# Patient Record
Sex: Female | Born: 1970 | Race: White | Hispanic: No | Marital: Married | Smoking: Former smoker
Health system: Southern US, Community
[De-identification: ages and names within clinical notes are randomized; demographics above are authoritative.]

## PROBLEM LIST (undated history)

## (undated) DIAGNOSIS — F419 Anxiety disorder, unspecified: Secondary | ICD-10-CM

## (undated) DIAGNOSIS — N63 Unspecified lump in unspecified breast: Secondary | ICD-10-CM

## (undated) DIAGNOSIS — IMO0002 Reserved for concepts with insufficient information to code with codable children: Secondary | ICD-10-CM

## (undated) DIAGNOSIS — R229 Localized swelling, mass and lump, unspecified: Secondary | ICD-10-CM

## (undated) DIAGNOSIS — F32A Depression, unspecified: Secondary | ICD-10-CM

## (undated) DIAGNOSIS — F329 Major depressive disorder, single episode, unspecified: Secondary | ICD-10-CM

## (undated) HISTORY — DX: Reserved for concepts with insufficient information to code with codable children: IMO0002

## (undated) HISTORY — DX: Depression, unspecified: F32.A

## (undated) HISTORY — DX: Anxiety disorder, unspecified: F41.9

## (undated) HISTORY — DX: Unspecified lump in unspecified breast: N63.0

## (undated) HISTORY — DX: Localized swelling, mass and lump, unspecified: R22.9

## (undated) HISTORY — DX: Major depressive disorder, single episode, unspecified: F32.9

---

## 2004-07-14 ENCOUNTER — Emergency Department: Payer: Self-pay | Admitting: Unknown Physician Specialty

## 2007-10-02 ENCOUNTER — Other Ambulatory Visit: Payer: Self-pay

## 2007-10-02 ENCOUNTER — Emergency Department: Payer: Self-pay | Admitting: Internal Medicine

## 2009-07-22 ENCOUNTER — Ambulatory Visit: Payer: Self-pay | Admitting: Family Medicine

## 2009-08-22 ENCOUNTER — Ambulatory Visit: Payer: Self-pay | Admitting: Gastroenterology

## 2009-08-28 ENCOUNTER — Ambulatory Visit: Payer: Self-pay | Admitting: Gastroenterology

## 2011-03-29 ENCOUNTER — Ambulatory Visit: Payer: Self-pay | Admitting: Internal Medicine

## 2011-04-02 ENCOUNTER — Ambulatory Visit: Payer: Self-pay

## 2011-04-29 ENCOUNTER — Ambulatory Visit: Payer: Self-pay

## 2011-05-10 ENCOUNTER — Ambulatory Visit: Payer: Self-pay | Admitting: Family Medicine

## 2011-05-10 LAB — CREATININE, SERUM: Creatinine: 0.93 mg/dL (ref 0.60–1.30)

## 2011-10-27 ENCOUNTER — Ambulatory Visit: Payer: Self-pay

## 2012-03-06 ENCOUNTER — Ambulatory Visit: Payer: Self-pay | Admitting: Medical

## 2012-08-08 ENCOUNTER — Ambulatory Visit: Payer: Self-pay | Admitting: Family Medicine

## 2012-09-27 LAB — HM PAP SMEAR: HM Pap smear: NORMAL

## 2012-09-28 ENCOUNTER — Ambulatory Visit: Payer: Self-pay | Admitting: Family Medicine

## 2012-09-29 LAB — HM MAMMOGRAPHY: HM MAMMO: NORMAL

## 2012-11-08 ENCOUNTER — Ambulatory Visit: Payer: Self-pay

## 2013-07-14 ENCOUNTER — Emergency Department: Payer: Self-pay | Admitting: Emergency Medicine

## 2013-09-29 IMAGING — CR DG CHEST 2V
1 series · 2 of 2 positions shown · non-contrast
Comparison: none

REASON FOR EXAM: feels breathless
COMMENTS:

PROCEDURE:     MDR - MDR CHEST PA(OR AP) AND LATERAL  - March 29, 2011  [DATE]
RESULT:     Comparison: None

[Series 1: pa · 0.17mm/px · 2 of 2 slices shown]
[im 1/2]
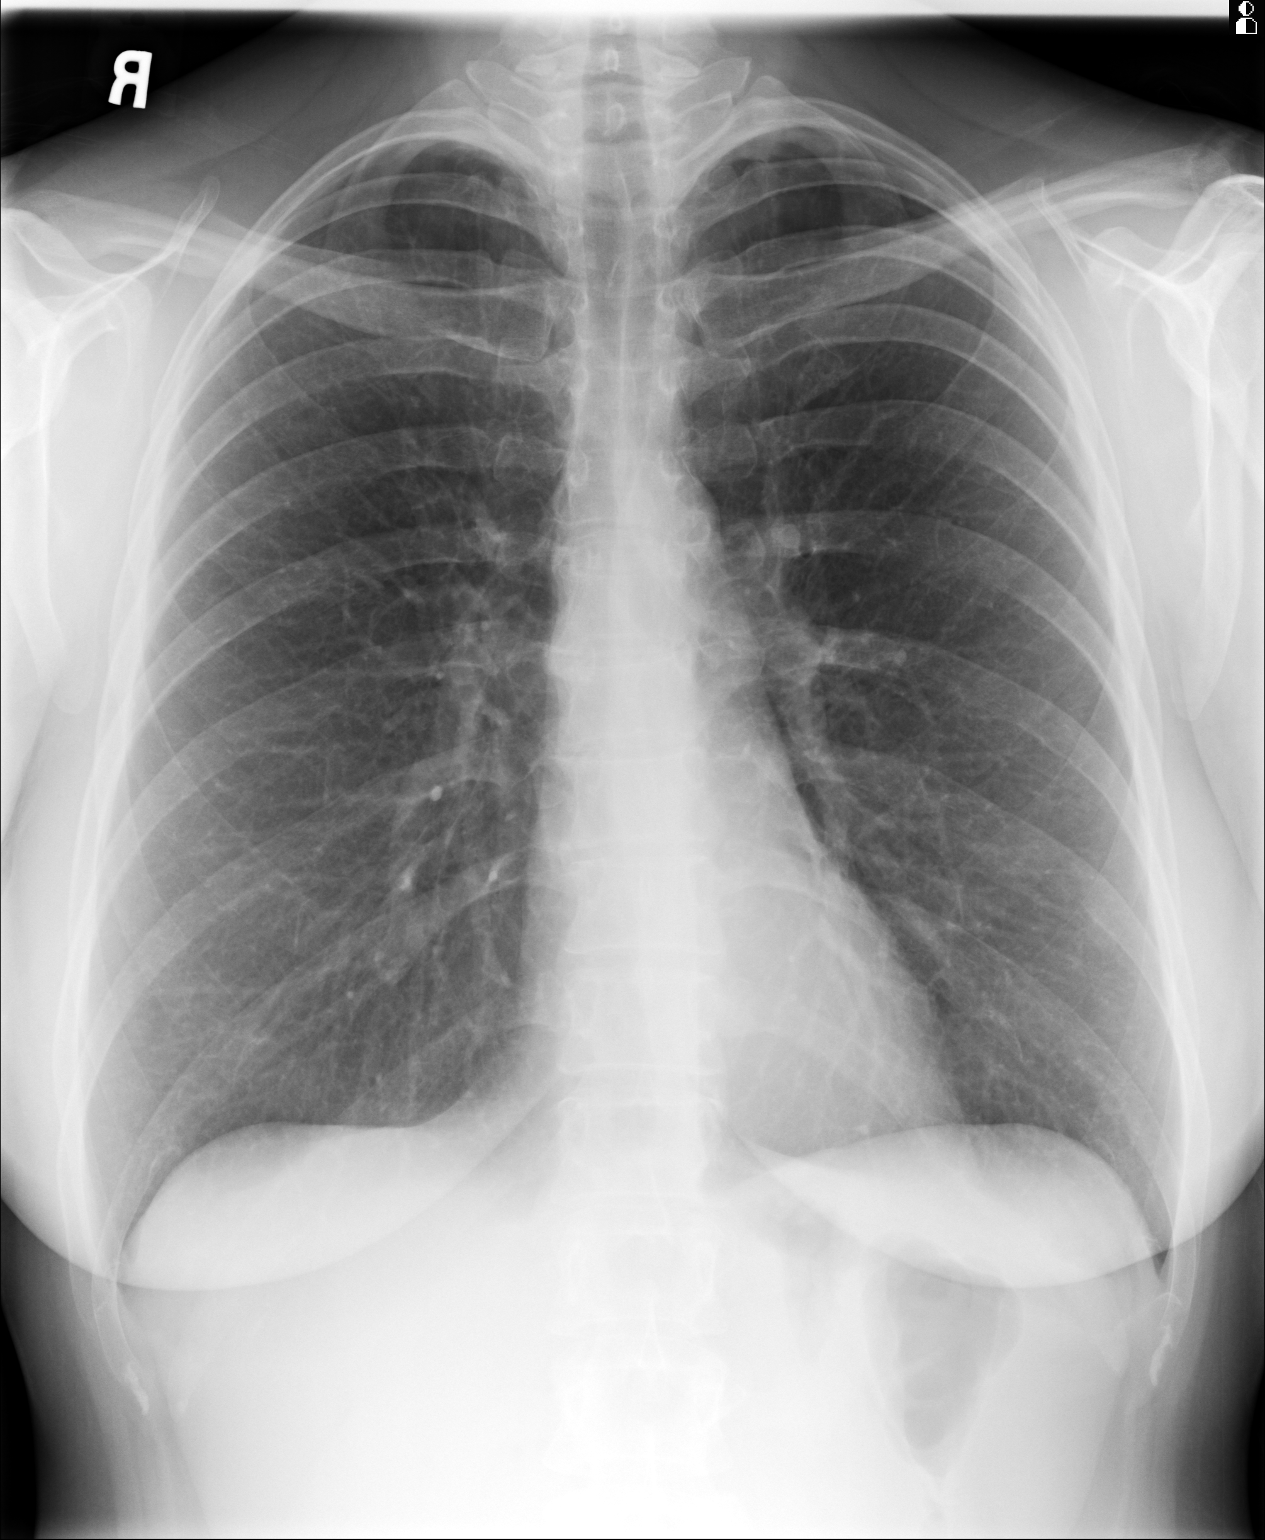
[im 2/2]
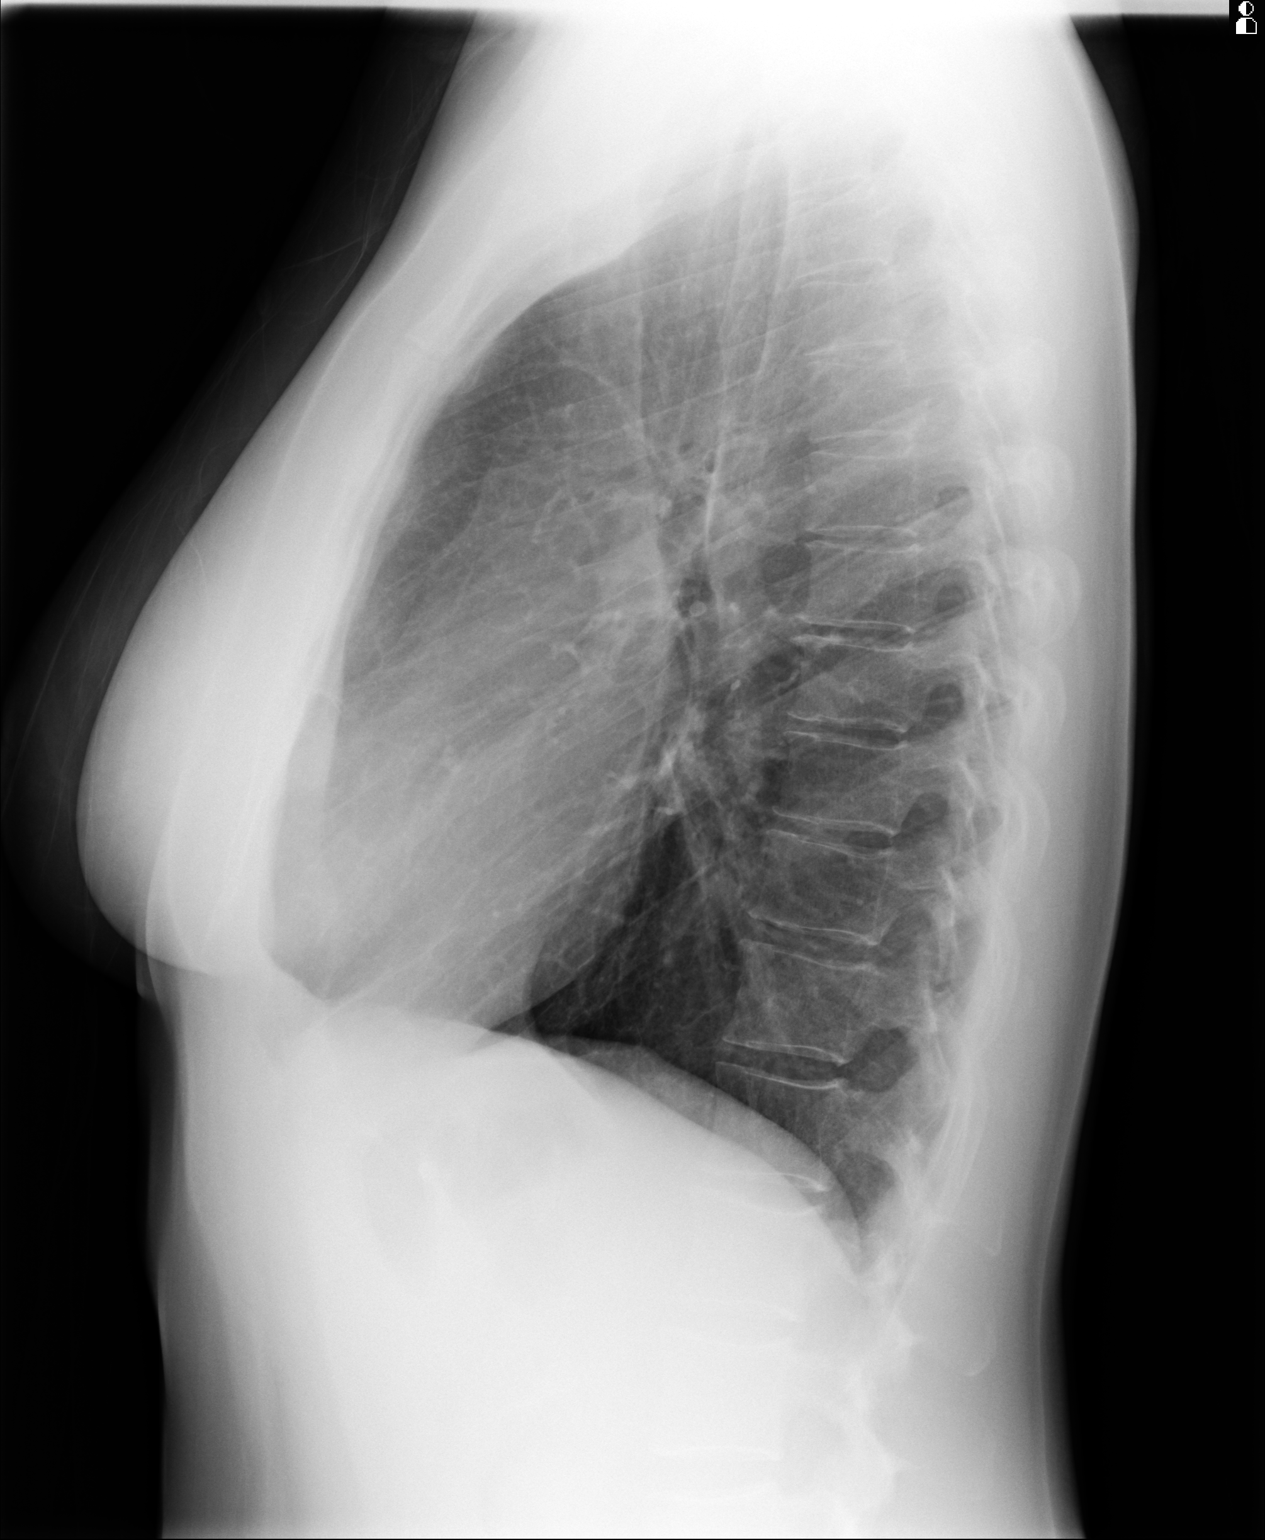

[2 of 2 positions shown; findings below may reference images not displayed]

FINDINGS: PA and lateral chest radiographs are provided.  There is no focal
parenchymal opacity, pleural effusion, or pneumothorax. The heart and
mediastinum are unremarkable.  The osseous structures are unremarkable.
IMPRESSION: No acute disease of the chest.

## 2013-10-03 IMAGING — CR DG CHEST 2V
1 series · 3 of 3 positions shown · non-contrast
Comparison: none

REASON FOR EXAM: chest pain and tightness
COMMENTS:

PROCEDURE:     MDR - MDR CHEST PA(OR AP) AND LATERAL  - April 02, 2011 [DATE]
RESULT:     Comparison: None

[Series 1: pa · 0.17mm/px · 3 of 3 slices shown]
[im 1/3]
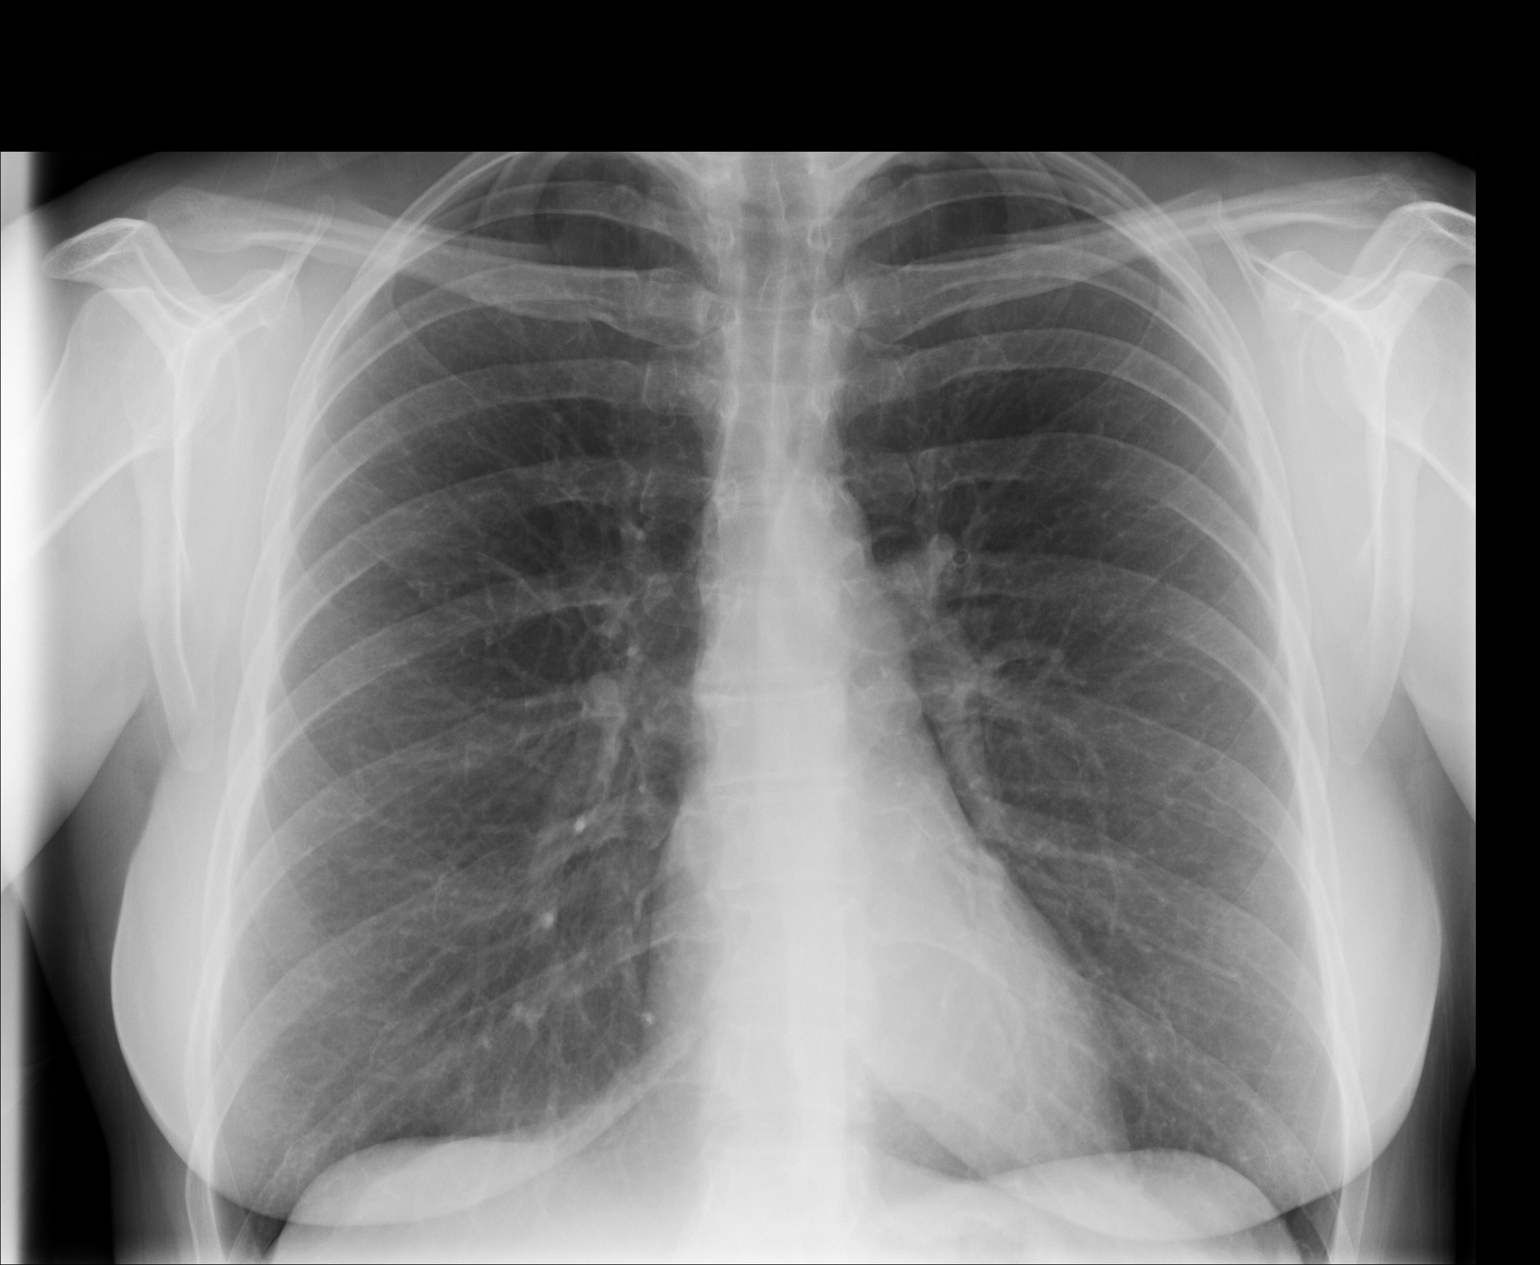
[im 2/3]
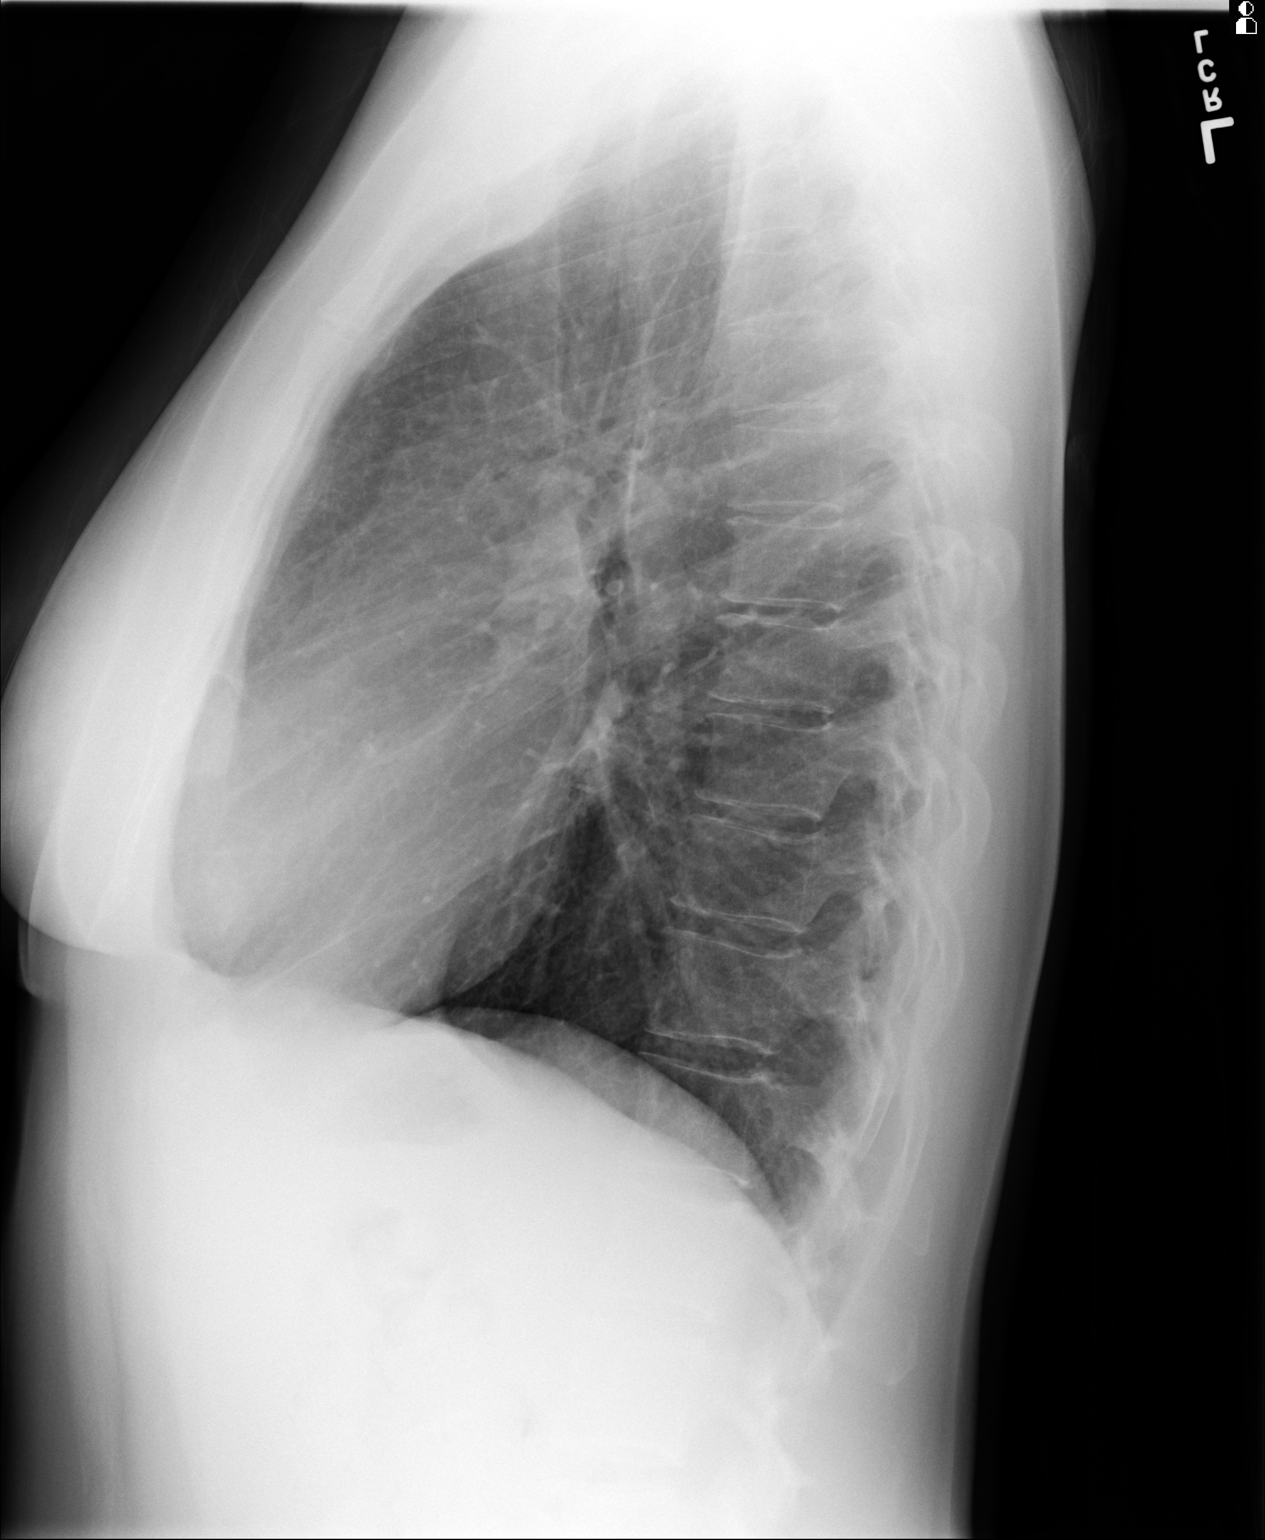
[im 3/3]
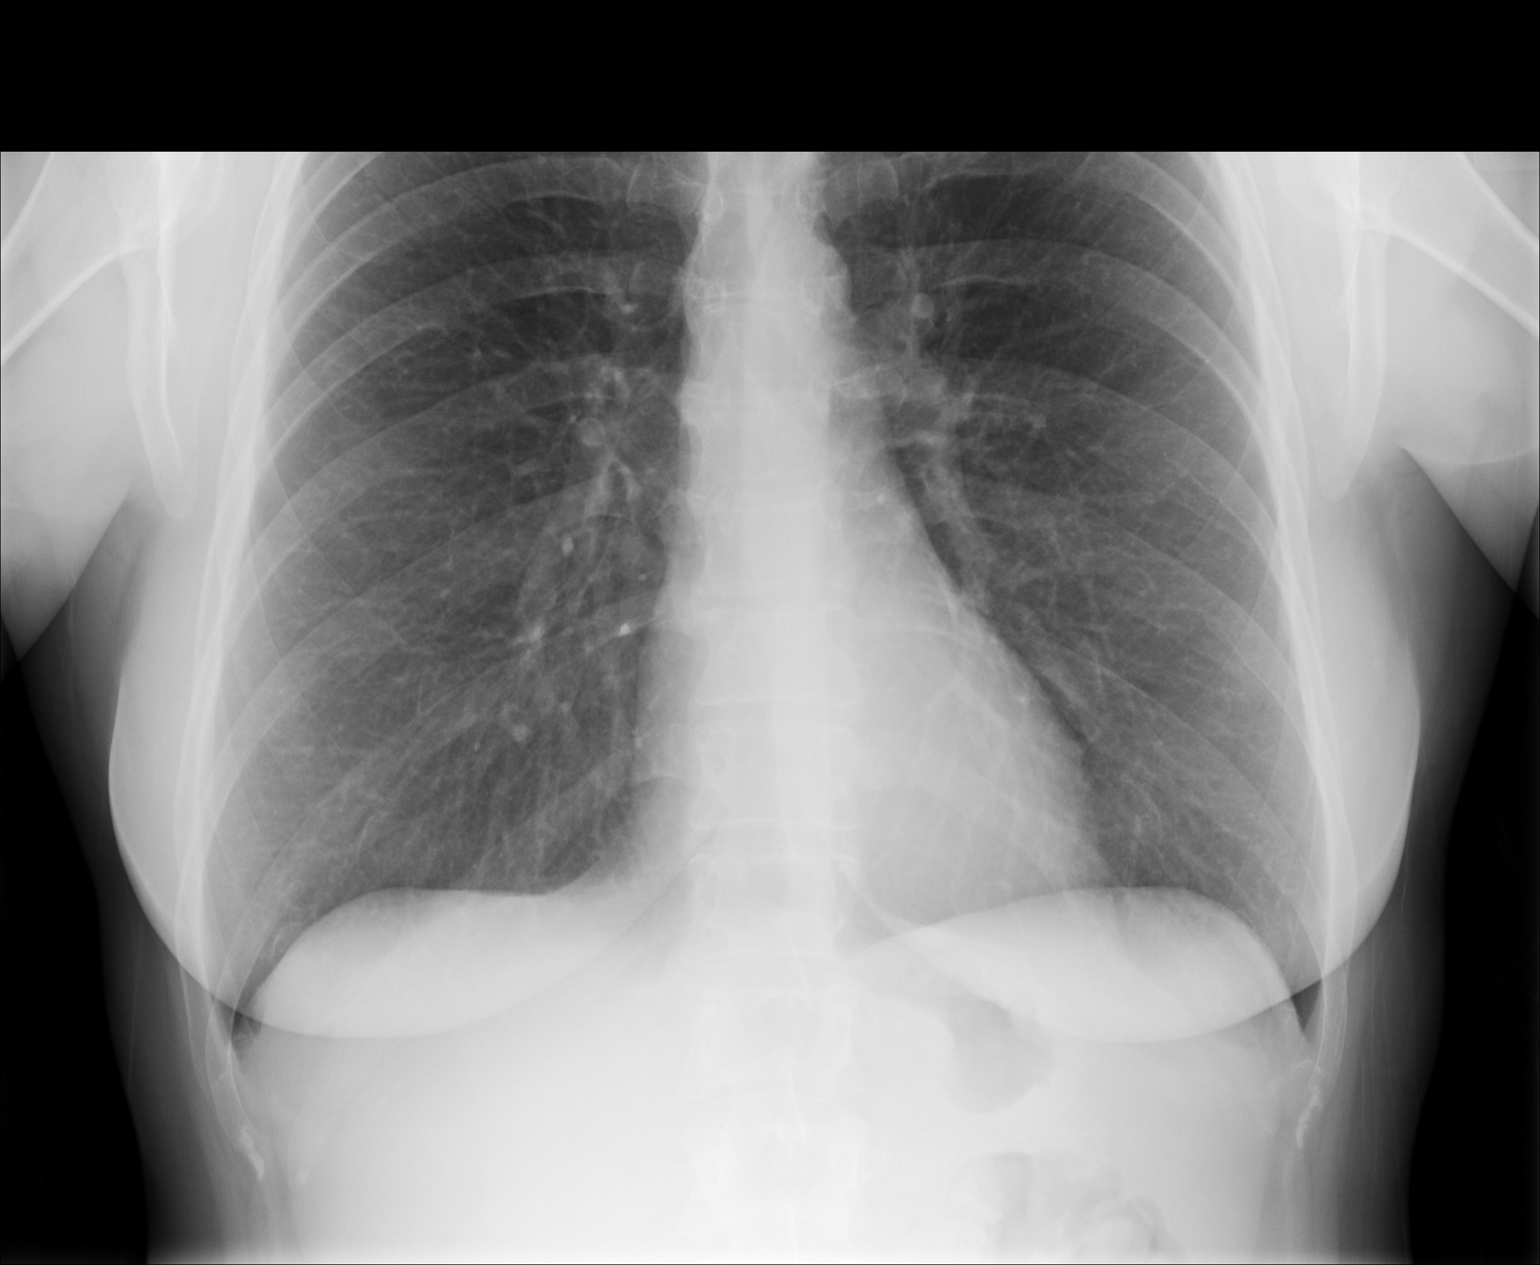

[3 of 3 positions shown; findings below may reference images not displayed]

FINDINGS: PA and lateral chest radiographs are provided.  There is no focal
parenchymal opacity, pleural effusion, or pneumothorax. The heart and
mediastinum are unremarkable.  The osseous structures are unremarkable.
IMPRESSION: No acute disease of the chest.

## 2015-02-18 ENCOUNTER — Ambulatory Visit (INDEPENDENT_AMBULATORY_CARE_PROVIDER_SITE_OTHER): Payer: Managed Care, Other (non HMO) | Admitting: Family Medicine

## 2015-02-18 ENCOUNTER — Encounter: Payer: Self-pay | Admitting: Family Medicine

## 2015-02-18 VITALS — BP 123/76 | HR 73 | Temp 97.7°F | Resp 16 | Ht 69.0 in | Wt 189.8 lb

## 2015-02-18 DIAGNOSIS — F418 Other specified anxiety disorders: Secondary | ICD-10-CM | POA: Diagnosis not present

## 2015-02-18 DIAGNOSIS — Z23 Encounter for immunization: Secondary | ICD-10-CM | POA: Diagnosis not present

## 2015-02-18 DIAGNOSIS — F329 Major depressive disorder, single episode, unspecified: Secondary | ICD-10-CM | POA: Insufficient documentation

## 2015-02-18 DIAGNOSIS — Z8249 Family history of ischemic heart disease and other diseases of the circulatory system: Secondary | ICD-10-CM | POA: Diagnosis not present

## 2015-02-18 DIAGNOSIS — F419 Anxiety disorder, unspecified: Principal | ICD-10-CM

## 2015-02-18 MED ORDER — FLUOXETINE HCL 40 MG PO CAPS: 40.0000 mg | ORAL_CAPSULE | Freq: Two times a day (BID) | ORAL | Status: DC

## 2015-02-18 NOTE — Patient Instructions (Signed)
We will refer you to [facility] for your anxiety and depression. In the meantime I will fill your medications for you until you find a new doctor.

## 2015-02-18 NOTE — Assessment & Plan Note (Signed)
Renew Prozac to cover patient until she can be seen by [facility]. Appears very well controlled.  Alarm symptoms reviewed.  RTC 6 mos if unable to find provider.

## 2015-02-18 NOTE — Progress Notes (Signed)
Subjective:    Patient ID: Tasha Ferguson, female    DOB: 26-Sep-1970, 44 y.o.   MRN: [id number]  HPI: Tasha Ferguson is a 44 y.o. female presenting on 02/18/2015 for Depression   HPI  Pt presents because she needs to find a new mental health professional. Previously saw Dr. Claudie Fisherman he left the practice. She is taking  prozac daily for depression and anxiety.  Seen by psych for 2 years.  Doing well on 80 of prozac past 2 years.  Mild sleep disturbances- trouble staying asleep- awakenings for 20-30 minutes prior to going back to sleep.   Desires flu shot.     Past Medical History  Diagnosis Date  . 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 Depression   . Breast mass in female     R side calcifiction    No current outpatient prescriptions on file prior to visit.   No current facility-administered medications on file prior to visit.    Review of Systems  Constitutional: Negative for fever and chills.  HENT: Negative.   Respiratory: Negative for cough, chest tightness and wheezing.   Cardiovascular: Negative for chest pain and leg swelling.  Gastrointestinal: Negative for nausea, vomiting, abdominal pain, diarrhea and constipation.  Endocrine: Negative.  Negative for cold intolerance, heat intolerance, polydipsia, polyphagia and polyuria.  Genitourinary: Negative for dysuria and difficulty urinating.  Musculoskeletal: Negative.   Neurological: Negative for dizziness, light-headedness and numbness.  Psychiatric/Behavioral: Positive for sleep disturbance. The patient is nervous/anxious.    Per HPI unless specifically indicated above     Objective:    BP 123/76 mmHg  Pulse 73  Temp(Src) 97.7 F (36.5 C) (Oral)  Resp 16  Ht  (1.753 m)  Wt 189 lb 12.8 oz (86.093 kg)  BMI 28.02 kg/m2  LMP 01/28/2015  Wt Readings from Last 3 Encounters:  02/18/15 189 lb 12.8 oz (86.093 kg)    Physical Exam  Constitutional: She is oriented to person, place, and time.  She appears well-developed and well-nourished.  HENT:  Head: Normocephalic and atraumatic.  Neck: Neck supple.  Cardiovascular: Normal rate, regular rhythm and normal heart sounds.  Exam reveals no gallop and no friction rub.   No murmur heard. Pulmonary/Chest: Effort normal and breath sounds normal. She has no wheezes. She exhibits no tenderness.  Abdominal: Soft. Normal appearance and bowel sounds are normal. She exhibits no distension and no mass. There is no tenderness. There is no rebound and no guarding.  Musculoskeletal: Normal range of motion. She exhibits no edema or tenderness.  Lymphadenopathy:    She has no cervical adenopathy.  Neurological: She is alert and oriented to person, place, and time.  Skin: Skin is warm and dry.   GAD 7 : Generalized Anxiety Score 02/18/2015  Nervous, Anxious, on Edge 1  Control/stop worrying 0  Worry too much - different things 1  Trouble relaxing 1  Restless 1  Easily annoyed or irritable 1  Afraid - awful might happen 1  Total GAD 7 Score 6  Anxiety Difficulty Somewhat difficult   Depression screen PHQ 2/9 02/18/2015  Decreased Interest 1  Down, Depressed, Hopeless 1  PHQ - 2 Score 2  Altered sleeping 1  Tired, decreased energy 1  Change in appetite 0  Feeling bad or failure about yourself  0  Trouble concentrating 0  Moving slowly or fidgety/restless 0  Suicidal thoughts 0  PHQ-9 Score 4  Difficult doing work/chores Somewhat difficult  Results for orders placed or performed in visit on 02/18/15  HM MAMMOGRAPHY  Result Value Ref Range   HM Mammogram normal   HM PAP SMEAR  Result Value Ref Range   HM Pap smear normal       Assessment & Plan:   Problem List Items Addressed This Visit      Other   Anxiety and depression - Primary    Renew Prozac to cover patient until she can be seen by [facility]. Appears very well controlled.  Alarm symptoms reviewed.  RTC 6 mos if unable to find provider.       Relevant Medications     FLUoxetine (PROZAC) 40 MG capsule   Other Relevant Orders   Comprehensive Metabolic Panel (CMET)   Ambulatory referral to Psychiatry    Other Visit Diagnoses    Family history of heart disease        Relevant Orders    Lipid Profile    Need for influenza vaccination           Meds ordered this encounter  Medications  . DISCONTD: FLUoxetine (PROZAC) 40 MG capsule    Sig: Take 40 mg by mouth daily.   Marland Kitchen. FLUoxetine (PROZAC) 40 MG capsule    Sig: Take 1 capsule (40 mg total) by mouth 2 (two) times daily.    Dispense:  180 capsule    Refill:  3    Order Specific Question:  Supervising Provider    Answer:  Janeann ForehandHAWKINS JR, JAMES H [[id number]]      Follow up plan: Return in about 6 months (around 08/19/2015).

## 2015-03-21 ENCOUNTER — Ambulatory Visit (INDEPENDENT_AMBULATORY_CARE_PROVIDER_SITE_OTHER): Payer: Managed Care, Other (non HMO) | Admitting: Licensed Clinical Social Worker

## 2015-03-21 DIAGNOSIS — F411 Generalized anxiety disorder: Secondary | ICD-10-CM | POA: Diagnosis not present
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
## 2015-04-29 ENCOUNTER — Ambulatory Visit (INDEPENDENT_AMBULATORY_CARE_PROVIDER_SITE_OTHER): Payer: Managed Care, Other (non HMO) | Admitting: Psychiatry

## 2015-04-29 ENCOUNTER — Encounter: Payer: Self-pay | Admitting: Psychiatry

## 2015-04-29 ENCOUNTER — Ambulatory Visit: Payer: Managed Care, Other (non HMO) | Admitting: Psychiatry

## 2015-04-29 VITALS — BP 128/78 | HR 83 | Temp 97.8°F | Ht 69.0 in | Wt 192.4 lb

## 2015-04-29 DIAGNOSIS — F418 Other specified anxiety disorders: Secondary | ICD-10-CM | POA: Diagnosis not present

## 2015-04-29 DIAGNOSIS — F419 Anxiety disorder, unspecified: Principal | ICD-10-CM

## 2015-04-29 DIAGNOSIS — F329 Major depressive disorder, single episode, unspecified: Secondary | ICD-10-CM

## 2015-04-29 MED ORDER — FLUOXETINE HCL 40 MG PO CAPS: 40.0000 mg | ORAL_CAPSULE | Freq: Two times a day (BID) | ORAL | Status: DC
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
## 2015-06-25 ENCOUNTER — Ambulatory Visit: Payer: Managed Care, Other (non HMO) | Admitting: Psychiatry

## 2015-07-23 ENCOUNTER — Encounter: Payer: Self-pay | Admitting: Psychiatry

## 2015-07-23 ENCOUNTER — Ambulatory Visit (INDEPENDENT_AMBULATORY_CARE_PROVIDER_SITE_OTHER): Payer: 59 | Admitting: Psychiatry

## 2015-07-23 VITALS — BP 122/78 | HR 106 | Temp 98.8°F | Ht 69.0 in | Wt 193.0 lb

## 2015-07-23 DIAGNOSIS — F329 Major depressive disorder, single episode, unspecified: Secondary | ICD-10-CM

## 2015-07-23 DIAGNOSIS — F419 Anxiety disorder, unspecified: Secondary | ICD-10-CM

## 2015-07-23 DIAGNOSIS — F418 Other specified anxiety disorders: Secondary | ICD-10-CM

## 2015-07-23 DIAGNOSIS — F411 Generalized anxiety disorder: Secondary | ICD-10-CM

## 2015-07-23 DIAGNOSIS — F32A Depression, unspecified: Secondary | ICD-10-CM

## 2015-07-23 MED ORDER — VENLAFAXINE HCL ER 37.5 MG PO CP24: 37.5000 mg | ORAL_CAPSULE | Freq: Every day | ORAL | Status: DC

## 2015-07-23 MED ORDER — FLUOXETINE HCL 40 MG PO CAPS: 40.0000 mg | ORAL_CAPSULE | Freq: Every day | ORAL | Status: DC

## 2015-07-23 NOTE — Progress Notes (Signed)
Patient ID: Riccardo Dubinamela N Perko, female   DOB: 1970-06-27, 45 y.o.   MRN: [id number]  Psychiatric Progress Note  Patient Identification: Riccardo Dubinamela N Andaya MRN:  [id number] Date of Evaluation:  07/23/2015 Referral Source: Dr.Chen Chief Complaint:  tired Chief Complaint    Follow-up; Medication Refill     Visit Diagnosis:    ICD-9-CM ICD-10-CM   1. GAD (generalized anxiety disorder) 300.02 F41.1   2. Anxiety and depression 300.4 F41.8 FLUoxetine (PROZAC) 40 MG capsule   Diagnosis:   Patient Active Problem List   Diagnosis Date Noted  . Anxiety and depression [F41.8] 02/18/2015   History of Present Illness:  Ms.Fluitt is a 45 yo WF, seen for follow up of major depression. Patient states she would like to change the prozac , says she feels tired all the time. Taking frequent naps and feeling drained. Fair sleep and appetite. Mood is stable. States she would like to try a different medication to address the tiredness and wean off the Prozac. Patient reports some stressors with her children. States that her 45 year old daughter is moving out to live with her boyfriend. Patient states that she would rather her daughter went to college. States her 45 year old son has Asperger's but down his working currently and also writing a book. He is also planning to to college. Experimented with Cymbalta and Wellbutrin in the past, did not work well.    Past Medical History:  Past Medical History  Diagnosis Date  . 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 Depression   . Breast mass in female     R side calcifiction  . Anxiety     Past Surgical History  Procedure Laterality Date  . Cesarean section      1994, 1998   Family History:  Family History  Problem Relation Age of Onset  . Hypertension Mother   . Depression Mother   . Diabetes Father   . Alcohol abuse Father   . Anxiety disorder Father   . Depression Father   . Alcohol abuse Sister   . Alcohol abuse Brother   . Drug abuse  Brother    Social History:   Social History   Social History  . Marital Status: Married    Spouse Name: N/A  . Number of Children: N/A  . Years of Education: N/A   Social History Main Topics  . Smoking status: Current Every Day Smoker    Types: E-cigarettes  . Smokeless tobacco: Never Used  . Alcohol Use: No  . Drug Use: No  . Sexual Activity: Yes   Other Topics Concern  . None   Social History Narrative   Additional Social History:   Musculoskeletal: Strength & Muscle Tone: within normal limits Gait & Station: normal Patient leans: N/A  Psychiatric Specialty Exam: HPI  ROS  Blood pressure 122/78, pulse 106, temperature 98.8 F (37.1 C), temperature source Tympanic, height 5\' 9"  (1.753 m), weight 193 lb (87.544 kg), SpO2 97 %.Body mass index is 28.49 kg/(m^2).  General Appearance: Casual  Eye Contact:  Fair  Speech:  Clear and Coherent  Volume:  Normal  Mood:  Euthymic  Affect:  Congruent  Thought Process:  Coherent  Orientation:  Full (Time, Place, and Person)  Thought Content:  WDL  Suicidal Thoughts:  No  Homicidal Thoughts:  No  Memory:  Immediate;   Fair Recent;   Fair Remote;   Fair  Judgement:  Fair  Insight:  Fair  Psychomotor Activity:  Normal  Concentration:  Fair  Recall:  [employer] of Knowledge:Fair  Language: Fair  Akathisia:  No  Handed:  Right  AIMS (if indicated):    Assets:  Communication Skills Desire for Improvement Housing Physical Health Social Support  ADL's:  Intact  Cognition: WNL  Sleep:  good   Is the patient at risk to self?  No. Has the patient been a risk to self in the past 6 months?  No. Has the patient been a risk to self within the distant past?  Yes.   Is the patient a risk to others?  No. Has the patient been a risk to others in the past 6 months?  No. Has the patient been a risk to others within the distant past?  No.  Allergies:   Allergies  Allergen Reactions  . Codeine Nausea Only    Nausea and  vomiting   Current Medications: Current Outpatient Prescriptions  Medication Sig Dispense Refill  . FLUoxetine (PROZAC) 40 MG capsule Take 1 capsule (40 mg total) by mouth daily. 30 capsule 0  . venlafaxine XR (EFFEXOR XR) 37.5 MG 24 hr capsule Take 1 capsule (37.5 mg total) by mouth daily. 30 capsule 0   No current facility-administered medications for this visit.    Previous Psychotropic Medications: Yes   Substance Abuse History in the last 12 months:  No.  Consequences of Substance Abuse: Negative  Medical Decision Making:  Established Problem, Stable/Improving (1), Review of Psycho-Social Stressors (1), Review or order clinical lab tests (1) and Review of Medication Regimen & Side Effects (2)  Treatment Plan Summary: Medication management MDD Decrease Prozac to  po qd. Start effexor at 37.5mg  po qd. discussed side effects and benefits including long-term increase in blood pressure. Patient is not interested in seeing a therapist at this time. She states that the issues she has had since a child are not down going to be fixed and she is okay with that. Return to clinic in 2 weeks time or call before if necessary.    Damari Hiltz 5/10/20171:53 PM

## 2015-08-13 ENCOUNTER — Ambulatory Visit
Admission: EM | Admit: 2015-08-13 | Discharge: 2015-08-13 | Disposition: A | Discharge status: Home / Self Care | Payer: Commercial Managed Care - PPO | Attending: Family Medicine | Admitting: Family Medicine

## 2015-08-13 DIAGNOSIS — S39012A Strain of muscle, fascia and tendon of lower back, initial encounter: Secondary | ICD-10-CM | POA: Diagnosis not present

## 2015-08-13 MED ORDER — NAPROXEN 500 MG PO TABS: 500.0000 mg | ORAL_TABLET | Freq: Two times a day (BID) | ORAL | Status: DC

## 2015-08-13 MED ORDER — CYCLOBENZAPRINE HCL 5 MG PO TABS: 5.0000 mg | ORAL_TABLET | Freq: Two times a day (BID) | ORAL | Status: DC | PRN

## 2015-08-13 NOTE — ED Notes (Signed)
Patient complains of low back pain. Patient states that pain started on Monday morning. Patient states that pain occurred once she woke up on Monday and has been progressively getting worse.

## 2015-08-13 NOTE — ED Provider Notes (Signed)
CSN: [id number]     Arrival date & time 08/13/15  1302 History   First MD Initiated Contact with Patient 08/13/15 1427     Chief Complaint  Patient presents with  . Back Pain   (Consider location/radiation/quality/duration/timing/severity/associated sxs/prior Treatment) HPI: Patient presents today with symptoms of lower back pain. Patient states that she was mopping on Sunday and went to bed fine. She woke up on Monday and started to have lower back pain. She states that her symptoms are localized to the lower back and do not radiate anywhere. She denies any tingling numbness of the lower extremities or any foot drop. She denies any urinary symptoms including incontinence. She denies any fever or chills. She denies any known lower back issues. She has tried ice on the area and Tylenol. Her last menstrual period was about 3 weeks ago.  Past Medical History  Diagnosis Date  . 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 Depression   . Breast mass in female     R side calcifiction  . Anxiety    Past Surgical History  Procedure Laterality Date  . Cesarean section      19 94, 1998   Family History  Problem Relation Age of Onset  . Hypertension Mother   . Depression Mother   . Diabetes Father   . Alcohol abuse Father   . Anxiety disorder Father   . Depression Father   . Alcohol abuse Sister   . Alcohol abuse Brother   . Drug abuse Brother    Social History  Substance Use Topics  . Smoking status: Former Smoker    Types: E-cigarettes  . Smokeless tobacco: Never Used  . Alcohol Use: No   OB History    No data available     Review of Systems: Review of systems negative except mentioned above.  Allergies  Codeine  Home Medications   Prior to Admission medications   Medication Sig Start Date End Date Taking? Authorizing Provider  cyclobenzaprine (FLEXERIL) 5 MG tablet Take 1 tablet (5 mg total) by mouth 2 (two) times daily as needed for muscle spasms. Can cause  drowsiness. 08/13/15   Jolene ProvostKirtida Tyresa Prindiville, MD  FLUoxetine (PROZAC) 40 MG capsule Take 1 capsule (40 mg total) by mouth daily. 07/23/15   Himabindu Ravi, MD  naproxen (NAPROSYN) 500 MG tablet Take 1 tablet (500 mg total) by mouth 2 (two) times daily. Take with food. 08/13/15   Jolene ProvostKirtida Jewel Mcafee, MD  venlafaxine XR (EFFEXOR XR) 37.5 MG 24 hr capsule Take 1 capsule (37.5 mg total) by mouth daily. 07/23/15 07/22/16  Himabindu Ravi, MD   Meds Ordered and Administered this Visit  Medications - No data to display  BP 127/80 mmHg  Pulse 97  Temp(Src) 97.6 F (36.4 C) (Tympanic)  Resp 16  Ht 5\' 9"  (1.753 m)  Wt 190 lb (86.183 kg)  BMI 28.05 kg/m2  SpO2 100%  LMP 07/30/2015 No data found.   Physical Exam   GENERAL: NAD RESP: CTA B CARD: RRR BACK: mild generalized tenderness to lower lumbar area, decreased ROM with flexion>extension, 5/5 strength of LEs, -SLR, nv intact, no flank tenderness  NEURO: CN II-XII groslly intact   ED Course  Procedures (including critical care time)  Labs Review Labs Reviewed - No data to display  Imaging Review No results found.    MDM   1. Back strain, initial encounter   Will treat with Naprosyn and Flexeril when necessary. Ice/heat discussed. Recommend patient  follow up with her primary care physician or seek other medical attention if her symptoms do persist or worsen as discussed. Patient may require imaging if her symptoms do persist or worsen.     Jolene Provost, MD 08/13/15 [phone]

## 2017-02-18 ENCOUNTER — Encounter: Payer: Self-pay | Admitting: Nurse Practitioner

## 2017-02-18 ENCOUNTER — Ambulatory Visit: Payer: Commercial Managed Care - PPO | Admitting: Nurse Practitioner

## 2017-02-18 VITALS — BP 103/59 | HR 74 | Temp 98.0°F | Ht 69.0 in | Wt 187.6 lb

## 2017-02-18 DIAGNOSIS — F329 Major depressive disorder, single episode, unspecified: Secondary | ICD-10-CM | POA: Diagnosis not present

## 2017-02-18 DIAGNOSIS — F419 Anxiety disorder, unspecified: Secondary | ICD-10-CM

## 2017-02-18 DIAGNOSIS — F32A Depression, unspecified: Secondary | ICD-10-CM

## 2017-02-18 DIAGNOSIS — F410 Panic disorder [episodic paroxysmal anxiety] without agoraphobia: Secondary | ICD-10-CM

## 2017-02-18 MED ORDER — BUSPIRONE HCL 10 MG PO TABS: 10.0000 mg | ORAL_TABLET | Freq: Three times a day (TID) | ORAL | 1 refills | Status: DC

## 2017-02-18 MED ORDER — FLUOXETINE HCL 20 MG PO CAPS: 20.0000 mg | ORAL_CAPSULE | Freq: Every day | ORAL | 1 refills | Status: DC

## 2017-02-18 NOTE — Progress Notes (Signed)
Subjective:    Patient ID: Tasha Ferguson, female    DOB: 1971/01/03, 46 y.o.   MRN: [id number]  Tasha Ferguson is a 46 y.o. female presenting on 02/18/2017 for Anxiety (depression)   HPI Anxiety and Depression Pt was managed several years ago by psychiatrist Dr. Imogene Burnhen (retired) and was on prozac. Tasha Ferguson continued this medication w/ plan to return to psychiatry, but has no insurance coverage for psychiatrists in the area.  She has not reconnected w/ psychiatry and is currently on fluoxetine 80 mg once daily.  Pt states this dose is because she needed 60 mg once daily and there is no 60 mg pill.  Pt noted little difference in symptoms of anxiety and depression on 80 mg once daily, but a significant worsening of drowsiness and having little energy.  Pt reports significant internal anxiety and panic attacks at least once weekly w/ racing heart rate, shortness of breath, and tunnel vision.  Sometimes occurs w/o trigger.  Pt has known triggers of being near trains, on/under bridges.  She reports no significant life stressors at this time.  GAD 7 : Generalized Anxiety Score 02/18/2017 02/18/2015  Nervous, Anxious, on Edge 2 1  Control/stop worrying 1 0  Worry too much - different things 1 1  Trouble relaxing 1 1  Restless 1 1  Easily annoyed or irritable 2 1  Afraid - awful might happen 0 1  Total GAD 7 Score 8 6  Anxiety Difficulty Somewhat difficult Somewhat difficult    Depression screen [facility] 02/18/2017 02/18/2015  Decreased Interest 1 1  Down, Depressed, Hopeless 1 1  PHQ - 2 Score 2 2  Altered sleeping 2 1  Tired, decreased energy 2 1  Change in appetite 2 0  Feeling bad or failure about yourself  1 0  Trouble concentrating 1 0  Moving slowly or fidgety/restless 1 0  Suicidal thoughts 0 0  PHQ-9 Score 11 4  Difficult doing work/chores Somewhat difficult Somewhat difficult    Social History   Tobacco Use  . Smoking status: Former Smoker    Types: E-cigarettes  . Smokeless  tobacco: Never Used  Substance Use Topics  . Alcohol use: No    Alcohol/week: 0.0 oz  . Drug use: No    Review of Systems Per HPI unless specifically indicated above     Objective:    BP (!) 103/59 (BP Location: Right Arm, Patient Position: Sitting, Cuff Size: Normal)   Pulse 74   Temp 98 F (36.7 C) (Oral)   Ht 5\' 9"  (1.753 m)   Wt 187 lb 9.6 oz (85.1 kg)   BMI 27.70 kg/m   Wt Readings from Last 3 Encounters:  02/18/17 187 lb 9.6 oz (85.1 kg)  08/13/15 190 lb (86.2 kg)  02/18/15 189 lb 12.8 oz (86.1 kg)    Physical Exam  General - healthy, well-appearing, NAD HEENT - Normocephalic, atraumatic Neck - supple, non-tender, no LAD, no thyromegaly Heart - RRR, no murmurs heard Lungs - Clear throughout all lobes, no wheezing, crackles, or rhonchi. Normal work of breathing. Extremeties - non-tender, no edema, cap refill < 2 seconds, peripheral pulses intact +2 bilaterally Skin - warm, dry Neuro - awake, alert, oriented x3, normal gait, no resting or active tremor Psych - Normal mood and affect, normal behavior   Results for orders placed or performed in visit on 02/18/15  HM MAMMOGRAPHY  Result Value Ref Range   HM Mammogram normal   HM PAP SMEAR  Result  Value Ref Range   HM Pap smear normal       Assessment & Plan:   Problem List Items Addressed This Visit      Other   Anxiety and depression - Primary    Currently moderately severe symptoms with 1-2 panic attacks per week.  Patient is currently taking fluoxetine 80 mg once daily with side effects of drowsiness and little energy after last dose increase.  Otherwise is fairly well controlled with ability to take care of daily activities.  Patient does not work outside of home, but does take care of housework and other responsibilities. Denies SI/HI and has no plans to carry out if SI/HI arise.  Plan: 1.  Decrease fluoxetine to 60 mg once daily.  Take three 20 mg capsules per dose. 2.  Start buspirone 10 mg twice daily  for panic disorder. 3.  Encourage stress management. 4.  Follow-up 6 weeks after med changes.  Okay to continue management for now from primary care.  Consider psychiatry referral if needed in future for worsening symptoms.      Relevant Medications   busPIRone (BUSPAR) 10 MG tablet   FLUoxetine (PROZAC) 20 MG capsule   Panic attack    See AP for anxiety and depression.      Relevant Medications   busPIRone (BUSPAR) 10 MG tablet   FLUoxetine (PROZAC) 20 MG capsule      Meds ordered this encounter  Medications  . DISCONTD: FLUoxetine (PROZAC) 20 MG capsule    Sig: Take 1 capsule (20 mg total) by mouth daily.    Dispense:  90 capsule    Refill:  1    Order Specific Question:   Supervising Provider    Answer:   Smitty CordsKARAMALEGOS, ALEXANDER J [[id number]]  . busPIRone (BUSPAR) 10 MG tablet    Sig: Take 1 tablet (10 mg total) by mouth 3 (three) times daily.    Dispense:  60 tablet    Refill:  1    Order Specific Question:   Supervising Provider    Answer:   Smitty CordsKARAMALEGOS, ALEXANDER J [[id number]]  . FLUoxetine (PROZAC) 20 MG capsule    Sig: Take 3 capsules (60 mg total) by mouth daily.    Dispense:  90 capsule    Refill:  1    Order Specific Question:   Supervising Provider    Answer:   Smitty CordsKARAMALEGOS, ALEXANDER J [[id number]]    Follow up plan: Return in about 6 weeks (around 04/01/2017) for anxiety.   A total of 30 minutes was spent face-to-face with this patient. Greater than 50% of this time was spent in counseling and coordination of care with the patient.    Wilhelmina McardleLauren Quenesha Douglass, DNP, AGPCNP-BC Adult Gerontology Primary Care Nurse Practitioner [facility] 02/24/2017, 1:05 PM

## 2017-02-18 NOTE — Patient Instructions (Addendum)
Tasha Ferguson, Thank you for coming in to clinic today.  1. START buspirone 10 mg tablet  - Take 1/2 tablet (5 mg) twice daily for 2 weeks. - On week 3: take 1 full tablet (10 mg) in morning, and 1/2 tablet in evening - On week 4: take 1 full tablet twice daily and continue.  2. FOR your prozac: - REDUCE to 60 mg once daily for the next 2 weeks (take 3 capsule).  - IN 2 weeks, Reduce to 40 mg once daily and continue at the lower dose.  If you need to stay on 60 mg for three weeks that is ok.   Please schedule a follow-up appointment with Wilhelmina McardleLauren Paddy Walthall, AGNP. Return in about 6 weeks (around 04/01/2017) for anxiety.   If you have any other questions or concerns, please feel free to call the clinic or send a message through MyChart. You may also schedule an earlier appointment if necessary.  You will receive a survey after today's visit either digitally by e-mail or paper by [employer] mail. Your experiences and feedback matter to us.  Please respond so we know how we are doing as we provide care for you.   Wilhelmina McardleLauren Zavian Slowey, DNP, AGNP-BC Adult Gerontology Nurse Practitioner [facility], [facility]

## 2017-02-24 ENCOUNTER — Encounter: Payer: Self-pay | Admitting: Nurse Practitioner

## 2017-02-24 DIAGNOSIS — F410 Panic disorder [episodic paroxysmal anxiety] without agoraphobia: Secondary | ICD-10-CM | POA: Insufficient documentation

## 2017-02-24 MED ORDER — FLUOXETINE HCL 20 MG PO CAPS: 60.0000 mg | ORAL_CAPSULE | Freq: Every day | ORAL | 1 refills | Status: DC

## 2017-02-24 NOTE — Assessment & Plan Note (Signed)
See AP for anxiety and depression.

## 2017-02-24 NOTE — Assessment & Plan Note (Addendum)
Currently moderately severe symptoms with 1-2 panic attacks per week.  Patient is currently taking fluoxetine 80 mg once daily with side effects of drowsiness and little energy after last dose increase.  Otherwise is fairly well controlled with ability to take care of daily activities.  Patient does not work outside of home, but does take care of housework and other responsibilities. Denies SI/HI and has no plans to carry out if SI/HI arise.  Plan: 1.  Decrease fluoxetine to 60 mg once daily.  Take three 20 mg capsules per dose. 2.  Start buspirone 10 mg twice daily for panic disorder. 3.  Encourage stress management. 4.  Follow-up 6 weeks after med changes.  Okay to continue management for now from primary care.  Consider psychiatry referral if needed in future for worsening symptoms.

## 2017-05-11 ENCOUNTER — Encounter: Payer: Self-pay | Admitting: Gynecology

## 2017-05-11 ENCOUNTER — Other Ambulatory Visit: Payer: Self-pay

## 2017-05-11 ENCOUNTER — Ambulatory Visit
Admission: EM | Admit: 2017-05-11 | Discharge: 2017-05-11 | Disposition: A | Discharge status: Home / Self Care | Payer: Commercial Managed Care - PPO | Attending: Family Medicine | Admitting: Family Medicine

## 2017-05-11 ENCOUNTER — Ambulatory Visit (INDEPENDENT_AMBULATORY_CARE_PROVIDER_SITE_OTHER): Payer: Commercial Managed Care - PPO

## 2017-05-11 DIAGNOSIS — M25551 Pain in right hip: Secondary | ICD-10-CM

## 2017-05-11 DIAGNOSIS — M25559 Pain in unspecified hip: Secondary | ICD-10-CM

## 2017-05-11 MED ORDER — HYDROCODONE-ACETAMINOPHEN 5-325 MG PO TABS: ORAL_TABLET | ORAL | 0 refills | Status: DC

## 2017-05-11 MED ORDER — PREDNISONE 20 MG PO TABS: ORAL_TABLET | ORAL | 0 refills | Status: DC

## 2017-05-11 NOTE — ED Provider Notes (Signed)
[facility]    CSN: [id number] Arrival date & time: 05/11/17  1232     History   Chief Complaint Chief Complaint  Patient presents with  . Hip Pain    HPI Tasha Ferguson is a 47 y.o. female.   The history is provided by the patient.  Hip Pain  This is a new problem. The current episode started more than 1 week ago. The problem occurs constantly. The problem has not changed since onset.Pertinent negatives include no chest pain, no abdominal pain, no headaches and no shortness of breath. Associated symptoms comments: Denies any falls or traumatic injuries; denies any fevers, chills, redness, swelling, rash. She has tried nothing for the symptoms.    Past Medical History:  Diagnosis Date  . Anxiety   . Breast mass in female    R side calcifiction  . Depression   . Mass    adernal gland being followed by urologist    Patient Active Problem List   Diagnosis Date Noted  . Panic attack 02/24/2017  . Anxiety and depression 02/18/2015    Past Surgical History:  Procedure Laterality Date  . CESAREAN SECTION     1994, 1998    OB History    No data available       Home Medications    Prior to Admission medications   Medication Sig Start Date End Date Taking? Authorizing Provider  busPIRone (BUSPAR) 10 MG tablet Take 1 tablet (10 mg total) by mouth 3 (three) times daily. 02/18/17  Yes Galen Manila, NP  cyclobenzaprine (FLEXERIL) 5 MG tablet Take 1 tablet (5 mg total) by mouth 2 (two) times daily as needed for muscle spasms. Can cause drowsiness. 08/13/15  Yes Jolene Provost, MD  FLUoxetine (PROZAC) 20 MG capsule Take 3 capsules (60 mg total) by mouth daily. 02/24/17  Yes Galen Manila, NP  HYDROcodone-acetaminophen (NORCO/VICODIN) 5-325 MG tablet 1-2 tabs po qd prn 05/11/17   Payton Mccallum, MD  predniSONE (DELTASONE) 20 MG tablet 2 tabs po qd x 5 days 05/11/17   Payton Mccallum, MD    Family History Family History  Problem Relation Age of  Onset  . Hypertension Mother   . Depression Mother   . Diabetes Father   . Alcohol abuse Father   . Anxiety disorder Father   . Depression Father   . Alcohol abuse Sister   . Alcohol abuse Brother   . Drug abuse Brother     Social History Social History   Tobacco Use  . Smoking status: Former Smoker    Types: E-cigarettes  . Smokeless tobacco: Never Used  Substance Use Topics  . Alcohol use: No    Alcohol/week: 0.0 oz  . Drug use: No     Allergies   Codeine   Review of Systems Review of Systems  Respiratory: Negative for shortness of breath.   Cardiovascular: Negative for chest pain.  Gastrointestinal: Negative for abdominal pain.  Neurological: Negative for headaches.     Physical Exam Triage Vital Signs ED Triage Vitals  Enc Vitals Group     BP 05/11/17 1248 137/79     Pulse Rate 05/11/17 1248 77     Resp 05/11/17 1248 16     Temp 05/11/17 1248 97.9 F (36.6 C)     Temp Source 05/11/17 1248 Oral     SpO2 05/11/17 1248 99 %     Weight 05/11/17 1249 184 lb (83.5 kg)     Height 05/11/17 1249 5'  9" (1.753 m)     Head Circumference --      Peak Flow --      Pain Score 05/11/17 1249 10     Pain Loc --      Pain Edu? --      Excl. in GC? --    No data found.  Updated Vital Signs BP 137/79 (BP Location: Left Arm)   Pulse 77   Temp 97.9 F (36.6 C) (Oral)   Resp 16   Ht 5\' 9"  (1.753 m)   Wt 184 lb (83.5 kg)   LMP 05/11/2017   SpO2 99%   BMI 27.17 kg/m   Visual Acuity Right Eye Distance:   Left Eye Distance:   Bilateral Distance:    Right Eye Near:   Left Eye Near:    Bilateral Near:     Physical Exam  Constitutional: She appears well-developed and well-nourished. No distress.  Musculoskeletal:       Right hip: She exhibits tenderness and bony tenderness. She exhibits normal range of motion, normal strength, no swelling, no crepitus, no deformity and no laceration.  Skin: She is not diaphoretic.  Nursing note and vitals  reviewed.    UC Treatments / Results  Labs (all labs ordered are listed, but only abnormal results are displayed) Labs Reviewed - No data to display  EKG  EKG Interpretation None       Radiology Dg Hip Unilat With Pelvis 2-3 Views Right  Result Date: 05/11/2017 CLINICAL DATA:  Patient c/o right hip pain x 2 week. Per patient no injury to her hip. No other symptoms. Pain with walking and getting up from a sitting position. EXAM: DG HIP (WITH OR WITHOUT PELVIS) 2-3V RIGHT COMPARISON:  None. FINDINGS: There is no evidence of hip fracture or dislocation. There is no evidence of arthropathy or other focal bone abnormality. IMPRESSION: Negative. Electronically Signed   By: Norva PavlovElizabeth  Brown M.D.   On: 05/11/2017 13:34    Procedures Procedures (including critical care time)  Medications Ordered in UC Medications - No data to display   Initial Impression / Assessment and Plan / UC Course  I have reviewed the triage vital signs and the nursing notes.  Pertinent labs & imaging results that were available during my care of the patient were reviewed by me and considered in my medical decision making (see chart for details).       Final Clinical Impressions(s) / UC Diagnoses   Final diagnoses:  Hip pain  Right hip pain  Pain of right hip joint    ED Discharge Orders        Ordered    predniSONE (DELTASONE) 20 MG tablet     05/11/17 1350    HYDROcodone-acetaminophen (NORCO/VICODIN) 5-325 MG tablet     05/11/17 1350     1. x-ray results and diagnosis reviewed with patient 2. rx as per orders above; reviewed possible side effects, interactions, risks and benefits  3. Recommend supportive treatment with rest, ice, otc analgesic prn 4. Follow-up prn if symptoms worsen or don't improve  Controlled Substance Prescriptions [place] Controlled Substance Registry consulted? Not Applicable   Payton Mccallumonty, Lindalee Huizinga, MD 05/11/17 [phone]

## 2017-05-11 NOTE — ED Triage Notes (Signed)
Patient c/o right hip pain x 2 week. Per patient no injury to her hip.

## 2017-05-11 NOTE — Discharge Instructions (Signed)
Rest, ice

## 2018-03-29 ENCOUNTER — Ambulatory Visit (INDEPENDENT_AMBULATORY_CARE_PROVIDER_SITE_OTHER): Payer: 59 | Admitting: Nurse Practitioner

## 2018-03-29 ENCOUNTER — Other Ambulatory Visit: Payer: Self-pay

## 2018-03-29 ENCOUNTER — Encounter: Payer: Self-pay | Admitting: Nurse Practitioner

## 2018-03-29 VITALS — BP 111/54 | HR 79 | Temp 98.3°F | Ht 69.0 in | Wt 178.4 lb

## 2018-03-29 DIAGNOSIS — F419 Anxiety disorder, unspecified: Secondary | ICD-10-CM | POA: Diagnosis not present

## 2018-03-29 DIAGNOSIS — F410 Panic disorder [episodic paroxysmal anxiety] without agoraphobia: Secondary | ICD-10-CM | POA: Diagnosis not present

## 2018-03-29 DIAGNOSIS — F32A Depression, unspecified: Secondary | ICD-10-CM

## 2018-03-29 DIAGNOSIS — F329 Major depressive disorder, single episode, unspecified: Secondary | ICD-10-CM

## 2018-03-29 MED ORDER — FLUOXETINE HCL 40 MG PO CAPS: 80.0000 mg | ORAL_CAPSULE | Freq: Every day | ORAL | 1 refills | Status: DC

## 2018-03-29 NOTE — Patient Instructions (Addendum)
Tasha Ferguson,   Thank you for coming in to clinic today.  1. Sleep Hygiene Tips  Take medicines only as directed by your health care provider.  Keep regular sleeping and waking hours. Avoid naps.  Keep a sleep diary to help you and your health care provider figure out what could be causing your insomnia. Include:  When you sleep.  When you wake up during the night.  How well you sleep.  How rested you feel the next day.  Any side effects of medicines you are taking.  What you eat and drink.  Make your bedroom a comfortable place where it is easy to fall asleep:  Put up shades or special blackout curtains to block light from outside.  Use a white noise machine to block noise.  Keep the temperature cool.  Exercise regularly as directed by your health care provider. Avoid exercising right before bedtime.  Use relaxation techniques to manage stress. Ask your health care provider to suggest some techniques that may work well for you. These may include:  Breathing exercises.  Routines to release muscle tension.  Visualizing peaceful scenes.  Cut back on alcohol, caffeinated beverages, and cigarettes, especially close to bedtime. These can disrupt your sleep.  Do not overeat or eat spicy foods right before bedtime. This can lead to digestive discomfort that can make it hard for you to sleep.  Limit screen use before bedtime. This includes:  Watching TV.  Using your smartphone, tablet, and computer.  Stick to a routine. This can help you fall asleep faster. Try to do a quiet activity, brush your teeth, and go to bed at the same time each night.  Get out of bed if you are still awake after 15 minutes of trying to sleep. Keep the lights down, but try reading or doing a quiet activity. When you feel sleepy, go back to bed.  Make sure that you drive carefully. Avoid driving if you feel very sleepy.  Keep all follow-up appointments as directed by your health care  provider. This is important.  PSYCH COUNSELING ONLY - Going to a counselor can help you learn your tools to manage stress.  Internal Referral: Delight Ovens, LCSW [facility] [address]  Self Referral: 1. Karen [place] [facility]   Address: [address] [place], [place], [place] [address] Hours: Open today  9AM-7PM Phone: [phone]  2. Anell Barr [employer], [facility]  - [facility] Address: [address] [place], [place] [address] Phone: [phone]  Please schedule a follow-up appointment with Wilhelmina Mcardle, AGNP. Return in about 3 months (around 06/28/2018) for anxiety, depression.  If you have any other questions or concerns, please feel free to call the clinic or send a message through MyChart. You may also schedule an earlier appointment if necessary.  You will receive a survey after today's visit either digitally by e-mail or paper by [employer]. Your experiences and feedback matter to [place].  Please respond so we know how we are doing as we provide care for you.   Wilhelmina Mcardle, DNP, AGNP-BC Adult Gerontology Nurse Practitioner [facility], [facility]

## 2018-03-29 NOTE — Progress Notes (Signed)
Subjective:    Patient ID: Tasha Ferguson, female    DOB: 04-16-1970, 48 y.o.   MRN: [id number]  Tasha Ferguson is a 48 y.o. female presenting on 03/29/2018 for Anxiety (depression. The pt was being seen at [facility] , but states she can no longer afford the co-pay.)   HPI Anxiety/Depression Was seen by me in Dec 2018 and was reduced to fluoxetine 60 mg once daily from 80 mg at last visit.  Also started buspirone 10 mg once daily and has noticed no significant improvement.  Since Tasha Ferguson, she has had psychiatric care and has most recently been on Prozac 80 mg.  She has had stable dose over the last 12-13 months.  She has been seen by Dr. Demetrius Charity from psychiatry for med management only.  No counseling. Patient feels like anxiety is worsened situationally at this time.  Daughter moved to [place] for relationship, so is undergoing stress due to marriage and her move.  Anxious and angry.  This has worsened over the past couple of months during holidays and is not having a relationship with in-person visits.  Regular phone/facetime contact.  She does have some hope for change in future for daugher to travel back here. - For treatment of anxiety and depression, patient prefers to have less anger, irritability, "snapping at people."  Then has anxiety about driving car, but pushes herself to do it.   - States she finds good stress relief with her husband and her dog. - Uses meditative music for calming her mind some.    GAD 7 : Generalized Anxiety Score 03/29/2018 02/18/2017 02/18/2015  Nervous, Anxious, on Edge 1 2 1   Control/stop worrying 1 1 0  Worry too much - different things 1 1 1   Trouble relaxing 1 1 1   Restless 1 1 1   Easily annoyed or irritable 2 2 1   Afraid - awful might happen 0 0 1  Total GAD 7 Score 7 8 6   Anxiety Difficulty Somewhat difficult Somewhat difficult Somewhat difficult    Depression screen [facility] 2/9 03/29/2018 02/18/2017 02/18/2015  Decreased Interest 1 1 1   Down, Depressed, Hopeless 1  1 1   PHQ - 2 Score 2 2 2   Altered sleeping 2 2 1   Tired, decreased energy 2 2 1   Change in appetite 1 2 0  Feeling bad or failure about yourself  1 1 0  Trouble concentrating 1 1 0  Moving slowly or fidgety/restless 1 1 0  Suicidal thoughts 0 0 0  PHQ-9 Score 10 11 4   Difficult doing work/chores Somewhat difficult Somewhat difficult Somewhat difficult     Social History   Tobacco Use  . Smoking status: Former Smoker    Types: E-cigarettes  . Smokeless tobacco: Never Used  Substance Use Topics  . Alcohol use: No    Alcohol/week: 0.0 standard drinks  . Drug use: No    Review of Systems Per HPI unless specifically indicated above     Objective:    BP (!) 111/54 (BP Location: Right Arm, Patient Position: Sitting, Cuff Size: Normal)   Pulse 79   Temp 98.3 F (36.8 C) (Oral)   Ht 5\' 9"  (1.753 m)   Wt 178 lb 6.4 oz (80.9 kg)   BMI 26.35 kg/m   Wt Readings from Last 3 Encounters:  03/29/18 178 lb 6.4 oz (80.9 kg)  05/11/17 184 lb (83.5 kg)  02/18/17 187 lb 9.6 oz (85.1 kg)    Physical Exam Vitals signs reviewed.  Constitutional:  General: She is not in acute distress.    Appearance: She is well-developed.  HENT:     Head: Normocephalic and atraumatic.  Cardiovascular:     Rate and Rhythm: Normal rate and regular rhythm.     Pulses:          Radial pulses are 2+ on the right side and 2+ on the left side.       Posterior tibial pulses are 1+ on the right side and 1+ on the left side.     Heart sounds: Normal heart sounds, S1 normal and S2 normal.  Pulmonary:     Effort: Pulmonary effort is normal. No respiratory distress.     Breath sounds: Normal breath sounds and air entry.  Musculoskeletal:     Right lower leg: No edema.     Left lower leg: No edema.  Skin:    General: Skin is warm and dry.     Capillary Refill: Capillary refill takes less than 2 seconds.  Neurological:     Mental Status: She is alert and oriented to person, place, and time. Mental  status is at baseline.  Psychiatric:        Attention and Perception: Attention normal.        Mood and Affect: Mood and affect normal.        Speech: Speech normal.        Behavior: Behavior normal. Behavior is cooperative.        Thought Content: Thought content normal.        Cognition and Memory: Cognition and memory normal.        Judgment: Judgment normal.    Results for orders placed or performed in visit on 02/18/15  HM MAMMOGRAPHY  Result Value Ref Range   HM Mammogram normal   HM PAP SMEAR  Result Value Ref Range   HM Pap smear normal       Assessment & Plan:   Problem List Items Addressed This Visit      Other   Anxiety and depression - Primary   Relevant Medications   FLUoxetine (PROZAC) 40 MG capsule   Panic attack   Relevant Medications   FLUoxetine (PROZAC) 40 MG capsule      Moderate to severe anxiety with panic attacks usually with driving.  She has had stable medications and feels like anxiety is as well controlled currently as she would desire.  Has some coping skills/mechanisms but may be able to access others with return to counseling/therapy.  Plan: 1. Continue Prozac 80 mg once daily. 2. Recommend counseling - self-referral information given. 3. Follow-up in clinic 3 months.  Meds ordered this encounter  Medications  . FLUoxetine (PROZAC) 40 MG capsule    Sig: Take 2 capsules (80 mg total) by mouth daily.    Dispense:  180 capsule    Refill:  1    Order Specific Question:   Supervising Provider    Answer:   Smitty Cords [[id number]]    Follow up plan: Return in about 3 months (around 06/28/2018) for anxiety, depression.  Wilhelmina Mcardle, DNP, AGPCNP-BC Adult Gerontology Primary Care Nurse Practitioner [facility] 03/29/2018, 3:49 PM

## 2018-06-23 ENCOUNTER — Telehealth: Payer: Self-pay | Admitting: Nurse Practitioner

## 2018-06-23 DIAGNOSIS — F329 Major depressive disorder, single episode, unspecified: Secondary | ICD-10-CM

## 2018-06-23 DIAGNOSIS — F32A Depression, unspecified: Secondary | ICD-10-CM

## 2018-06-23 DIAGNOSIS — F419 Anxiety disorder, unspecified: Secondary | ICD-10-CM

## 2018-06-23 DIAGNOSIS — F410 Panic disorder [episodic paroxysmal anxiety] without agoraphobia: Secondary | ICD-10-CM

## 2018-06-23 MED ORDER — FLUOXETINE HCL 40 MG PO CAPS: 80.0000 mg | ORAL_CAPSULE | Freq: Every day | ORAL | 0 refills | Status: DC

## 2018-06-23 NOTE — Telephone Encounter (Signed)
Pt. Called requesting all refill for [facility] to [facility] to [facility] (with  A drive -thru) Pt  Call back # is [phone].

## 2018-06-23 NOTE — Addendum Note (Signed)
Addended by: Wilhelmina Mcardle R on: 06/23/2018 11:24 AM   Modules accepted: Orders

## 2018-06-29 ENCOUNTER — Encounter: Payer: Self-pay | Admitting: Nurse Practitioner

## 2018-06-29 ENCOUNTER — Ambulatory Visit (INDEPENDENT_AMBULATORY_CARE_PROVIDER_SITE_OTHER): Payer: 59 | Admitting: Nurse Practitioner

## 2018-06-29 ENCOUNTER — Other Ambulatory Visit: Payer: Self-pay

## 2018-06-29 DIAGNOSIS — J301 Allergic rhinitis due to pollen: Secondary | ICD-10-CM | POA: Diagnosis not present

## 2018-06-29 DIAGNOSIS — F410 Panic disorder [episodic paroxysmal anxiety] without agoraphobia: Secondary | ICD-10-CM

## 2018-06-29 DIAGNOSIS — F329 Major depressive disorder, single episode, unspecified: Secondary | ICD-10-CM

## 2018-06-29 DIAGNOSIS — F419 Anxiety disorder, unspecified: Secondary | ICD-10-CM

## 2018-06-29 DIAGNOSIS — F32A Depression, unspecified: Secondary | ICD-10-CM

## 2018-06-29 MED ORDER — HYDROXYZINE HCL 10 MG PO TABS: 10.0000 mg | ORAL_TABLET | Freq: Three times a day (TID) | ORAL | 0 refills | Status: DC | PRN

## 2018-06-29 MED ORDER — FLUOXETINE HCL 40 MG PO CAPS: 80.0000 mg | ORAL_CAPSULE | Freq: Every day | ORAL | 5 refills | Status: DC

## 2018-06-29 NOTE — Progress Notes (Signed)
Telemedicine Encounter: Disclosed to patient at start of encounter that we will provide appropriate telemedicine services.  Patient consents to be treated via phone prior to discussion. Discussed limitations of visit - Patient is at her home and is accessed via telephone. - Services are provided by Wilhelmina Mcardle from [facility].  Subjective:    Patient ID: Tasha Ferguson, female    DOB: 03/28/1970, 48 y.o.   MRN: [id number]  Tasha Ferguson is a 48 y.o. female presenting on 06/29/2018 for Anxiety (pt reports she has been having panic attacks that she contributes to the pandemic. depression) and Sinus Problem (post nasal drainage, facial pressure x 2 days )  HPI Sinus problems Patient has occasional sinus infections.  Symptoms 2 days include post nasal drainage, increased facial pressure, and "bad taste" to the drainage.   - Denies fever, cough, sore throat, no shortness of breath. - Also denies any ear pressure/pain/fullness, tooth/jaw pain.   Anxiety  Last panic attacks in past 1-2 per month.  Now patient has 1-2 panic attacks per week.  Symptoms include heart racing, rising BP, fidgety, nervous. Patient does an exercise to sit and meditate to focus on breathing and slowing HR.  Patient has had no significant limitations, but does "feel drained." Not preventing life. - Prior to Covid-19, was feeling very stable.   Depression screen [facility] 2/9 06/29/2018 03/29/2018 02/18/2017 02/18/2015  Decreased Interest 2 1 1 1   Down, Depressed, Hopeless 0 1 1 1   PHQ - 2 Score 2 2 2 2   Altered sleeping 2 2 2 1   Tired, decreased energy 2 2 2 1   Change in appetite 0 1 2 0  Feeling bad or failure about yourself  0 1 1 0  Trouble concentrating 0 1 1 0  Moving slowly or fidgety/restless 0 1 1 0  Suicidal thoughts 0 0 0 0  PHQ-9 Score 6 10 11 4   Difficult doing work/chores Not difficult at all Somewhat difficult Somewhat difficult Somewhat difficult    GAD 7 : Generalized Anxiety Score  06/29/2018 03/29/2018 02/18/2017 02/18/2015  Nervous, Anxious, on Edge 2 1 2 1   Control/stop worrying 2 1 1  0  Worry too much - different things 0 1 1 1   Trouble relaxing 1 1 1 1   Restless 1 1 1 1   Easily annoyed or irritable 0 2 2 1   Afraid - awful might happen 0 0 0 1  Total GAD 7 Score 6 7 8 6   Anxiety Difficulty Not difficult at all Somewhat difficult Somewhat difficult Somewhat difficult    Social History   Tobacco Use  . Smoking status: Former Smoker    Types: E-cigarettes  . Smokeless tobacco: Never Used  Substance Use Topics  . Alcohol use: No    Alcohol/week: 0.0 standard drinks  . Drug use: No    Review of Systems Per HPI unless specifically indicated above     Objective:    There were no vitals taken for this visit.  Wt Readings from Last 3 Encounters:  03/29/18 178 lb 6.4 oz (80.9 kg)  05/11/17 184 lb (83.5 kg)  02/18/17 187 lb 9.6 oz (85.1 kg)    Physical Exam Patient remotely monitored.  Verbal communication appropriate.  Cognition normal.   Results for orders placed or performed in visit on 02/18/15  HM MAMMOGRAPHY  Result Value Ref Range   HM Mammogram normal   HM PAP SMEAR  Result Value Ref Range   HM Pap smear normal  Assessment & Plan:   Problem List Items Addressed This Visit      Other   Anxiety and depression   Relevant Medications   hydrOXYzine (ATARAX/VISTARIL) 10 MG tablet   FLUoxetine (PROZAC) 40 MG capsule   Panic attack - Primary   Relevant Medications   hydrOXYzine (ATARAX/VISTARIL) 10 MG tablet   FLUoxetine (PROZAC) 40 MG capsule    Other Visit Diagnoses    Seasonal allergic rhinitis due to pollen        # Allergic rhinitis Consistent with seasonal allergic rhinitis - Today no evidence of acute sinusitis or complication.  - Has not recently used loratadine, cetirizine, flonase  Plan: 1. Start nasal fluticasone 2 sprays each nostril once daily for 4 weeks. 2. START antihistamine cetirizine 10 mg or loratadine 10 mg  once daily. 3. May use nasal saline prn if needed.  Use at alternate time from fluticasone. 4. Follow-up in future, as needed, consider 2nd opinion from ENT/allergy.  Monitor for continued sinus congestion for possible sinusitis after 10 days total symptoms or if increasing symptoms consistent with sinusitis.   # Anxiety Worsening today on exam with increase in panic attack frequency.  Medications tolerated without side effects and patient continues to be able to use non-pharm techniques to stop panic attacks.  Continue fluoxetine at current doses.  May start hydroxyzine 10-20 mg tid prn anxiety for worsening panic attack.  Continue to recommend use of non-pharm stress management strategies.  Refilled fluoxetine. Sent new hydroxyzine.   . Followup 3 months and sooner prn.  May need return to psychiatry if panic disorder becomes uncontrolled.   Meds ordered this encounter  Medications  . hydrOXYzine (ATARAX/VISTARIL) 10 MG tablet    Sig: Take 1 tablet (10 mg total) by mouth 3 (three) times daily as needed for anxiety.    Dispense:  40 tablet    Refill:  0    Order Specific Question:   Supervising Provider    Answer:   Smitty CordsKARAMALEGOS, ALEXANDER J [[id number]]  . FLUoxetine (PROZAC) 40 MG capsule    Sig: Take 2 capsules (80 mg total) by mouth daily.    Dispense:  60 capsule    Refill:  5    Order Specific Question:   Supervising Provider    Answer:   Smitty CordsKARAMALEGOS, ALEXANDER J [[id number]]   - Time spent in direct consultation with patient via telemedicine about above concerns: 12 minutes  Follow up plan: Follow-up 3 months and sooner if needed for anxiety if panic is not improved with hydroxyzine.  Wilhelmina McardleLauren Amanda Pote, DNP, AGPCNP-BC Adult Gerontology Primary Care Nurse Practitioner [facility] 06/29/2018, 10:00 AM

## 2018-07-10 ENCOUNTER — Telehealth: Payer: Self-pay

## 2018-07-11 NOTE — Telephone Encounter (Signed)
Open in error

## 2019-07-15 ENCOUNTER — Other Ambulatory Visit: Payer: Self-pay | Admitting: Nurse Practitioner

## 2019-07-15 DIAGNOSIS — F410 Panic disorder [episodic paroxysmal anxiety] without agoraphobia: Secondary | ICD-10-CM

## 2019-07-15 DIAGNOSIS — F329 Major depressive disorder, single episode, unspecified: Secondary | ICD-10-CM

## 2019-07-15 DIAGNOSIS — F419 Anxiety disorder, unspecified: Secondary | ICD-10-CM

## 2019-07-15 NOTE — Telephone Encounter (Signed)
Requested medication (s) are due for refill today: yes  Requested medication (s) are on the active medication list: yes  Last refill:  06/29/18  Future visit scheduled: no  Notes to clinic:  Called pt but unable to LM on VM (VM not set up) Overdue > 3 months for f/u   Requested Prescriptions  Pending Prescriptions Disp Refills   FLUoxetine (PROZAC) 40 MG capsule [Pharmacy Med Name: FLUOXETINE HCL 40 MG CAPSULE] 60 capsule 5    Sig: TAKE 2 CAPSULES BY MOUTH DAILY      Psychiatry:  Antidepressants - SSRI Failed - 07/15/2019 12:55 AM      Failed - Completed PHQ-2 or PHQ-9 in the last 360 days.      Failed - Valid encounter within last 6 months    Recent Outpatient Visits           1 year ago Panic attack   [facility] Galen Manila, NP   1 year ago Anxiety and depression   [facility] Kyung Rudd, Alison Stalling, NP   2 years ago Anxiety and depression   [facility] Kyung Rudd, Alison Stalling, NP   4 years ago Anxiety and depression   [facility] Laroy Apple, Laurel Dimmer, NP

## 2019-10-05 ENCOUNTER — Telehealth: Payer: Self-pay | Admitting: Family Medicine

## 2019-10-05 ENCOUNTER — Other Ambulatory Visit: Payer: Self-pay

## 2019-10-05 ENCOUNTER — Encounter: Payer: Self-pay | Admitting: Family Medicine

## 2019-10-05 ENCOUNTER — Ambulatory Visit: Payer: 59 | Admitting: Family Medicine

## 2019-10-05 VITALS — BP 130/72 | HR 84 | Temp 98.4°F | Resp 18 | Ht 69.0 in | Wt 189.0 lb

## 2019-10-05 DIAGNOSIS — F410 Panic disorder [episodic paroxysmal anxiety] without agoraphobia: Secondary | ICD-10-CM | POA: Diagnosis not present

## 2019-10-05 DIAGNOSIS — F329 Major depressive disorder, single episode, unspecified: Secondary | ICD-10-CM | POA: Diagnosis not present

## 2019-10-05 DIAGNOSIS — F419 Anxiety disorder, unspecified: Secondary | ICD-10-CM | POA: Diagnosis not present

## 2019-10-05 DIAGNOSIS — E278 Other specified disorders of adrenal gland: Secondary | ICD-10-CM | POA: Diagnosis not present

## 2019-10-05 MED ORDER — HYDROXYZINE HCL 10 MG PO TABS: 10.0000 mg | ORAL_TABLET | Freq: Three times a day (TID) | ORAL | 0 refills | Status: DC | PRN

## 2019-10-05 MED ORDER — FLUOXETINE HCL 40 MG PO CAPS: 80.0000 mg | ORAL_CAPSULE | Freq: Every day | ORAL | 2 refills | Status: DC

## 2019-10-05 NOTE — Assessment & Plan Note (Addendum)
Currently stable and well controlled anxiety/depression.  PHQ9 - 3/GAD7 - 1.  Taking fluoxetine 80mg  daily and hydroxyzine 10mg  TID PRN with good symptom management.  Denies any SI/HI.  Discussed currently overdue for labs (last 2013), no urine testing on file, and last PAP testing completed 09/27/2012.  Patient with fear of needles, discussed, we can have labs drawn at her physical in 2-3 months and if needing additional medications to help with her fear of needles, will prescribe for that next visit.  Patient agreeable to plan.  Plan: 1. Continue fluoxetine 80mg  daily 2. Continue hydroxyzine 10mg  TID PRN for anxiety 3. RTC in 2-3 months for CPE

## 2019-10-05 NOTE — Assessment & Plan Note (Signed)
Left adrenal mass on CT from 2014, with imaging from 2013 reviewed on file.  No follow up noted on CT report, contacted radiologist on call to discuss report and receive recommendations.  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

## 2019-10-05 NOTE — Progress Notes (Signed)
Subjective:    Patient ID: Tasha Ferguson, female    DOB: January 05, 1971, 49 y.o.   MRN: [id number]  Tasha Ferguson is a 49 y.o. female presenting on 10/05/2019 for Anxiety (need medication refills )  HPI  Tasha Ferguson presents to clinic for medication refills.  Denies any acute concerns today.  Feels that the fluoxetine and hydroxyzine have helped manage her anxiety well.   Depression screen [facility] 2/9 10/05/2019 06/29/2018 03/29/2018  Decreased Interest 1 2 1   Down, Depressed, Hopeless 0 0 1  PHQ - 2 Score 1 2 2   Altered sleeping 1 2 2   Tired, decreased energy 1 2 2   Change in appetite 0 0 1  Feeling bad or failure about yourself  0 0 1  Trouble concentrating 0 0 1  Moving slowly or fidgety/restless 0 0 1  Suicidal thoughts 0 0 0  PHQ-9 Score 3 6 10   Difficult doing work/chores Not difficult at all Not difficult at all Somewhat difficult    Social History   Tobacco Use  . Smoking status: Former Smoker    Types: E-cigarettes  . Smokeless tobacco: Never Used  Vaping Use  . Vaping Use: Every day  . Substances: Nicotine  Substance Use Topics  . Alcohol use: No    Alcohol/week: 0.0 standard drinks  . Drug use: No    Review of Systems  Constitutional: Negative.   HENT: Negative.   Eyes: Negative.   Respiratory: Negative.   Cardiovascular: Negative.   Gastrointestinal: Negative.   Endocrine: Negative.   Genitourinary: Negative.   Musculoskeletal: Negative.   Skin: Negative.   Allergic/Immunologic: Negative.   Neurological: Negative.   Hematological: Negative.   Psychiatric/Behavioral: Negative.    Per HPI unless specifically indicated above     Objective:    BP (!) 130/72 (BP Location: Left Arm, Patient Position: Sitting, Cuff Size: Normal)   Pulse 84   Temp 98.4 F (36.9 C) (Oral)   Resp 18   Ht 5\' 9"  (1.753 m)   Wt 189 lb (85.7 kg)   LMP 03/16/2019   SpO2 100%   BMI 27.91 kg/m   Wt Readings from Last 3 Encounters:  10/05/19 189 lb (85.7 kg)  03/29/18 178 lb  6.4 oz (80.9 kg)  05/11/17 184 lb (83.5 kg)    Physical Exam Vitals reviewed.  Constitutional:      General: She is not in acute distress.    Appearance: Normal appearance. She is well-developed, well-groomed and overweight. She is not ill-appearing or toxic-appearing.  HENT:     Head: Normocephalic and atraumatic.     Nose:     Comments: is in place, covering mouth and nose. Eyes:     General: Lids are normal. Vision grossly intact.        Right eye: No discharge.        Left eye: No discharge.     Extraocular Movements: Extraocular movements intact.     Conjunctiva/sclera: Conjunctivae normal.     Pupils: Pupils are equal, round, and reactive to light.  Cardiovascular:     Rate and Rhythm: Normal rate and regular rhythm.     Pulses: Normal pulses.     Heart sounds: Normal heart sounds. No murmur heard.  No friction rub. No gallop.   Pulmonary:     Effort: Pulmonary effort is normal. No respiratory distress.     Breath sounds: Normal breath sounds.  Musculoskeletal:     Right lower leg: No edema.  Left lower leg: No edema.  Skin:    General: Skin is warm and dry.     Capillary Refill: Capillary refill takes less than 2 seconds.  Neurological:     General: No focal deficit present.     Mental Status: She is alert and oriented to person, place, and time.  Psychiatric:        Attention and Perception: Attention and perception normal.        Mood and Affect: Mood and affect normal.        Speech: Speech normal.        Behavior: Behavior normal. Behavior is cooperative.        Thought Content: Thought content normal.        Cognition and Memory: Cognition and memory normal.        Judgment: Judgment normal.    Results for orders placed or performed in visit on 02/18/15  HM MAMMOGRAPHY  Result Value Ref Range   HM Mammogram normal   HM PAP SMEAR  Result Value Ref Range   HM Pap smear normal       Assessment & Plan:   Problem List Items Addressed This  Visit      Other   Anxiety and depression    Currently stable and well controlled anxiety/depression.  PHQ9 - 3/GAD7 - 1.  Taking fluoxetine 80mg  daily and hydroxyzine 10mg  TID PRN with good symptom management.  Denies any SI/HI.  Discussed currently overdue for labs (last 2013), no urine testing on file, and last PAP testing completed 09/27/2012.  Patient with fear of needles, discussed, we can have labs drawn at her physical in 2-3 months and if needing additional medications to help with her fear of needles, will prescribe for that next visit.  Patient agreeable to plan.  Plan: 1. Continue fluoxetine 80mg  daily 2. Continue hydroxyzine 10mg  TID PRN for anxiety 3. RTC in 2-3 months for CPE      Relevant Medications   FLUoxetine (PROZAC) 40 MG capsule   hydrOXYzine (ATARAX/VISTARIL) 10 MG tablet   Panic attack    See AP for anxiety & depression      Relevant Medications   FLUoxetine (PROZAC) 40 MG capsule   hydrOXYzine (ATARAX/VISTARIL) 10 MG tablet   Left adrenal mass (HCC)    Left adrenal mass on CT from 2014, with imaging from 2013 reviewed on file.  No follow up noted on CT report, contacted radiologist on call to discuss report and receive recommendations.  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         Meds ordered this encounter  Medications  . FLUoxetine (PROZAC) 40 MG capsule    Sig: Take 2 capsules (80 mg total) by mouth daily.    Dispense:  60 capsule    Refill:  2  . hydrOXYzine (ATARAX/VISTARIL) 10 MG tablet    Sig: Take 1 tablet (10 mg total) by mouth 3 (three) times daily as needed for anxiety.    Dispense:  40 tablet    Refill:  0      Follow up plan: Return in about 2 months (around 12/06/2019) for CPE & Labs.   , FNP Family Nurse Practitioner [facility] 10/05/2019, 11:49  AM

## 2019-10-05 NOTE — Telephone Encounter (Signed)
Attempted to contact patient to review her CT from 2014 with concerns for left adrenal mass.  Voicemail box is not set up yet.  To let patient know, I spoke with radiologist that was on call for [facility] and had that provider look at report/imaging and reports is a left adrenal adenoma (fatty tissue) that does not require follow up monitoring unless we start to have abnormalities on chest xrays.  I can discuss with patient if she has any questions  Thanks

## 2019-10-05 NOTE — Assessment & Plan Note (Signed)
See A&P for anxiety/depression.

## 2019-10-05 NOTE — Patient Instructions (Signed)
The following recommendations are helpful adjuncts for helping rebalance your mood.  Eat a nourishing diet. Ensure adequate intake of calories, protein, carbs, fat, vitamins, and minerals. Prioritize whole foods at each meal, including meats, vegetables, fruits, nuts and seeds, etc.   Avoid inflammatory and/or "junk" foods, such as sugar, omega-6 fats, refined grains, chemicals, and preservatives are common in packaged and prepared foods. Minimize or completely avoid these ingredients and stick to whole foods with little to no additives. Cook from scratch as much as possible for more control over what you eat  Get enough sleep. Poor sleep is significantly associated with depression and anxiety. Make 7-9 hours of sleep nightly a top priority  Exercise appropriately. Exercise is known to improve brain functioning and boost mood. Aim for 30 minutes of daily physical activity. Avoid "overtraining," which can cause mental disturbances  Assess your light exposure. Not enough natural light during the day and too much artificial light can have a major impact on your mood. Get outside as often as possible during daylight hours. Minimize light exposure after dark and avoid the use of electronics that give off blue light before bed  Manage your stress.  Use daily stress management techniques such as meditation, yoga, or mindfulness to retrain your brain to respond differently to stress. Try deep breathing to deactivate your "fight or flight" response.  There are many of sources with apps like Headspace, Calm or a variety of YouTube videos (videos from Gwynne Edinger have guided meditation)  Prioritize your social life. Work on building social support with new friends or improve current relationships. Consider getting a pet that allows for companionship, social interaction, and physical touch. Try volunteering or joining a faith-based community to increase your sense of purpose  4-7-8 breathing technique at  bedtime: breathe in to count of 4, hold breath for count of 7, exhale for count of 8; do 3-5 times for letting go of overactive thoughts  Take time to play Unstructured "play" time can help reduce anxiety and depression Options for play include music, games, sports, dance, art, etc.  Try to add daily omega 3 fatty acids, magnesium, B complex, and balanced amino acid supplements to help improve mood and anxiety.  Sleep hygiene is the single most effective treatment for sleep issues, but it is hard work.  Tips for a good night's sleep:  -Keep sleep environment comfortable and conducive to sleep -Keep regular sleep schedule 7 nights a week -Avoiding naps during the day -Avoiding going to bed until drowsy and ready to sleep, not trying to sleep, and not watching the clock -Get out of bed if not asleep within 15-20 minutes and returning only when drowsy -Avoiding caffeine, nicotine, alcohol, and other substances that interfere with sleep before bedtime -Take an hour before your set bedtime and start to wind down: bath/shower, no more TV or phone (the blue light can interfere with sleeping), listen to soothing music, or meditation -No TV in your bedroom -Exercising regularly, at least 6 hours before sleep. Yoga and Tai Chi can improve sleep quality  There are a lot of books and apps that may help guide you with any of the following:   -Progressive muscle relaxation (involves methodical tension and relaxation of different Muscle groups throughout body)  Guided imagery  -YouTube - Gwynne Edinger has free videos on YouTube that can help with meditation and some   Abdominal breathing   Over the counter sleep aid one hour before bed- and gradually wean your use over  2-4 weeks  Some examples are : *Melatonin 5-10 mg *Sleepology (Can find on [employer]) taken according to packaging directions  There are a few online evidence based online programs, unfortunately they are not free.   Developed  by a sleep expert who created a drug-free program for insomnia proven more effective than sleeping pills.  www.cbtforinsomnia.com Sleepio is an evidence-based digital sleep improvement program   www.sleepio.com SHUTi is designed to actively help retrain your body and mind for great sleep through six engaging Cognitive Behavioral Therapy for Insomnia strategy and learning sessions  [email or web address]  We will plan to see you back in 2 months for your physical  You will receive a survey after today's visit either digitally by e-mail or paper by [place] mail. Your experiences and feedback matter to [place].  Please respond so we know how we are doing as we provide care for you.  Call us with any questions/concerns/needs.  It is my goal to be available to you for your health concerns.  Thanks for choosing me to be a partner in your healthcare needs!  Harlin Rain, FNP-C Family Nurse Practitioner [facility] Phone: [phone]

## 2019-12-06 ENCOUNTER — Other Ambulatory Visit (HOSPITAL_COMMUNITY)
Admission: RE | Admit: 2019-12-06 | Discharge: 2019-12-06 | Disposition: A | Discharge status: Home / Self Care | Payer: Commercial Managed Care - PPO | Source: Ambulatory Visit | Attending: Family Medicine | Admitting: Family Medicine

## 2019-12-06 ENCOUNTER — Ambulatory Visit (INDEPENDENT_AMBULATORY_CARE_PROVIDER_SITE_OTHER): Payer: Commercial Managed Care - PPO | Admitting: Family Medicine

## 2019-12-06 ENCOUNTER — Other Ambulatory Visit: Payer: Self-pay

## 2019-12-06 ENCOUNTER — Encounter: Payer: Self-pay | Admitting: Family Medicine

## 2019-12-06 VITALS — BP 130/68 | HR 85 | Temp 98.4°F | Resp 18 | Ht 69.0 in | Wt 185.0 lb

## 2019-12-06 DIAGNOSIS — F419 Anxiety disorder, unspecified: Secondary | ICD-10-CM

## 2019-12-06 DIAGNOSIS — B354 Tinea corporis: Secondary | ICD-10-CM | POA: Insufficient documentation

## 2019-12-06 DIAGNOSIS — Z124 Encounter for screening for malignant neoplasm of cervix: Secondary | ICD-10-CM | POA: Diagnosis not present

## 2019-12-06 DIAGNOSIS — Z0000 Encounter for general adult medical examination without abnormal findings: Secondary | ICD-10-CM | POA: Diagnosis not present

## 2019-12-06 DIAGNOSIS — R635 Abnormal weight gain: Secondary | ICD-10-CM

## 2019-12-06 DIAGNOSIS — F329 Major depressive disorder, single episode, unspecified: Secondary | ICD-10-CM

## 2019-12-06 DIAGNOSIS — G2581 Restless legs syndrome: Secondary | ICD-10-CM | POA: Diagnosis not present

## 2019-12-06 DIAGNOSIS — F32A Depression, unspecified: Secondary | ICD-10-CM

## 2019-12-06 DIAGNOSIS — Z79899 Other long term (current) drug therapy: Secondary | ICD-10-CM

## 2019-12-06 MED ORDER — ROPINIROLE HCL 2 MG PO TABS: 1.0000 mg | ORAL_TABLET | Freq: Every day | ORAL | 1 refills | Status: DC

## 2019-12-06 MED ORDER — CLOTRIMAZOLE-BETAMETHASONE 1-0.05 % EX CREA: 1.0000 "application " | TOPICAL_CREAM | Freq: Two times a day (BID) | CUTANEOUS | 0 refills | Status: DC

## 2019-12-06 MED ORDER — FLUCONAZOLE 150 MG PO TABS: ORAL_TABLET | ORAL | 0 refills | Status: DC

## 2019-12-06 NOTE — Assessment & Plan Note (Signed)
Has treated with topical OTC cream in the past without relief of symptoms.  Has been dealing this this rash for > 1 year on/off.  Discussed diflucan in combination with clotrimazole-betamethasone cream applying 2x per day, allowing for a barrier between rash and bra, washing any bras in contact with this skin in hot water and drying on high heat to prevent re-infection.  Plan: 1. Begin diflucan 150mg  tablet today and repeat in 72 hours 2. Begin clotrimazole-betamethasone 1 application twice daily for the next 10-14 days 3. Be sure to wash hands well before/after applying medicated cream to under left breast 4. RTC if symptoms worsen or fail to improve

## 2019-12-06 NOTE — Assessment & Plan Note (Signed)
Reports intermittent RLS that have interfered with sleep.  Denies this happening nightly but does happen frequently throughout the week.  Discussed starting low dose Requip 1mg  nightly when symptoms are present to help with reduction of RLS symptoms.  Patient interested in trial of medication.  Plan: 1. Begin Requip 1mg  nightly, as needed for RLS 2. Can titrate up to 2mg  nightly, if 1mg  is not adequately controlling symptoms 3. RTC in 4 weeks for re-evaluation

## 2019-12-06 NOTE — Patient Instructions (Addendum)
Have your labs drawn and we will contact you with the results  We have sent your PAP to the lab for testing.  Will contact you once the results are received.  I have sent in a prescription for Requip 1mg  to take at bedtime for your restless leg syndrome.  As we discussed, can go up to 2mg  nightly and we can re-evaluate in 4 weeks to see what dosage is working best for you.  I have sent in a prescription for a topical antifungal cream to apply under the left breast as directed.  I have also sent in a prescription for diflucan to take 1 tablet today and repeat the dosing in 72 hours.  Well Visit: Care Instructions Overview  Well visits can help you stay healthy. Your provider has checked your overall health and may have suggested ways to take good care of yourself. Your provider also may have recommended tests. At home, you can help prevent illness with healthy eating, regular exercise, and other steps.  Follow-up care is a key part of your treatment and safety. Be sure to make and go to all appointments, and call your provider if you are having problems. It's also a good idea to know your test results and keep a list of the medicines you take.  How can you care for yourself at home?   Get screening tests that you and your doctor decide on. Screening helps find diseases before any symptoms appear.   Eat healthy foods. Choose fruits, vegetables, whole grains, protein, and low-fat dairy foods. Limit fat, especially saturated fat. Reduce salt in your diet.   Limit alcohol. If you are a man, have no more than 2 drinks a day or 14 drinks a week. If you are a woman, have no more than 1 drink a day or 7 drinks a week.   Get at least 30 minutes of physical activity on most days of the week.  We recommend you go no more than 2 days in a row without exercise. Walking is a good choice. You also may want to do other activities, such as running, swimming, cycling, or playing tennis or team sports.  Discuss any changes in your exercise program with your provider.   Reach and stay at a healthy weight. This will lower your risk for many problems, such as obesity, diabetes, heart disease, and high blood pressure.   Do not smoke or allow others to smoke around you. If you need help quitting, talk to your provider about stop-smoking programs and medicines. These can increase your chances of quitting for good.  Can call 1-800-QUIT-NOW ((712)104-8401) for the [facility], assistance with smoking cessation.   Care for your mental health. It is easy to get weighed down by worry and stress. Learn strategies to manage stress, like deep breathing and mindfulness, and stay connected with your family and community. If you find you often feel sad or hopeless, talk with your provider. Treatment can help.   Talk to your provider about whether you have any risk factors for sexually transmitted infections (STIs). You can help prevent STIs if you wait to have sex with a new partner (or partners) until you've each been tested for STIs. It also helps if you use condoms (female or female condoms) and if you limit your sex partners to one person who only has sex with you. Vaccines are available for some STIs, such as HPV (these are age dependent).   If you think you may have  a problem with alcohol or drug use, talk to your provider. This includes prescription medicines (such as amphetamines and opioids) and illegal drugs (such as cocaine and methamphetamine). Your provider can help you figure out what type of treatment is best for you.   If you have concerns about domestic violence or intimate partner violence, there are resources available to you. National Domestic Abuse Hotline 501-033-9275   Protect your skin from too much sun. When you're outdoors from 10 a.m. to 4 p.m., stay in the shade or cover up with clothing and a hat with a wide brim. Wear sunglasses that block UV rays. Even when it's cloudy,  put broad-spectrum sunscreen (SPF 30 or higher) on any exposed skin.   See a dentist one or two times a year for checkups and to have your teeth cleaned.   See an eye doctor once per year for an eye exam.   Wear a seat belt in the car.  When should you call for help?  Watch closely for changes in your health, and be sure to contact your provider if you have any problems or symptoms that concern you.  We will plan to see you back in 12 months for your physical and in 4 weeks for a follow up on your restless leg syndrome  You will receive a survey after today's visit either digitally by e-mail or paper by [employer] mail. Your experiences and feedback matter to [place].  Please respond so we know how we are doing as we provide care for you.  Call us with any questions/concerns/needs.  It is my goal to be available to you for your health concerns.  Thanks for choosing me to be a partner in your healthcare needs!  Charlaine Dalton, FNP-C Family Nurse Practitioner [facility] Phone: [phone]

## 2019-12-06 NOTE — Assessment & Plan Note (Signed)
Last PAP testing not on file.  Due for PAP testing.  Plan: 1. PAP and HPV testing completed and sent to lab for evaluation

## 2019-12-06 NOTE — Progress Notes (Signed)
Subjective:    Patient ID: Tasha Ferguson, female    DOB: February 25, 1971, 49 y.o.   MRN: [id number]  Tasha Ferguson is a 49 y.o. female presenting on 12/06/2019 for Annual Exam   HPI   HEALTH MAINTENANCE:  Weight/BMI: Overweight, BMI 27.32% Physical activity: Stays active Diet: Regular Seatbelt: Yes Sunscreen: No, encouraged Mammogram: Would like to start next year PAP: Due, completed today HIV & Hep C Screening: Offered, declined GC/CT: Offered, declined Optometry: Every 2 years Dentistry: Every 6 months  IMMUNIZATIONS: Influenza: Declines Tetanus: Reports had <6 years ago, no record to review COVID: Completed, Pfizer, 06/20/2019 + 09/06/2019  Depression screen [facility] 2/9 10/05/2019 06/29/2018 03/29/2018  Decreased Interest 1 2 1   Down, Depressed, Hopeless 0 0 1  PHQ - 2 Score 1 2 2   Altered sleeping 1 2 2   Tired, decreased energy 1 2 2   Change in appetite 0 0 1  Feeling bad or failure about yourself  0 0 1  Trouble concentrating 0 0 1  Moving slowly or fidgety/restless 0 0 1  Suicidal thoughts 0 0 0  PHQ-9 Score 3 6 10   Difficult doing work/chores Not difficult at all Not difficult at all Somewhat difficult    Past Medical History:  Diagnosis Date  . Anxiety   . Breast mass in female    R side calcifiction  . Depression   . Mass    adernal gland being followed by urologist   Past Surgical History:  Procedure Laterality Date  . CESAREAN SECTION     1994, 1998   Social History   Socioeconomic History  . Marital status: Married    Spouse name: Not on file  . Number of children: Not on file  . Years of education: Not on file  . Highest education level: Not on file  Occupational History  . Not on file  Tobacco Use  . Smoking status: Former Smoker    Types: E-cigarettes  . Smokeless tobacco: Never Used  Vaping Use  . Vaping Use: Every day  . Substances: Nicotine  Substance and Sexual Activity  . Alcohol use: No    Alcohol/week: 0.0 standard drinks  .  Drug use: No  . Sexual activity: Yes  Other Topics Concern  . Not on file  Social History Narrative  . Not on file   Social Determinants of Health   Financial Resource Strain:   . Difficulty of Paying Living Expenses: Not on file  Food Insecurity:   . Worried About in the Last Year: Not on file  . Ran Out of Food in the Last Year: Not on file  Transportation Needs:   . Lack of Transportation (Medical): Not on file  . Lack of Transportation (Non-Medical): Not on file  Physical Activity:   . Days of Exercise per Week: Not on file  . Minutes of Exercise per Session: Not on file  Stress:   . Feeling of Stress : Not on file  Social Connections:   . Frequency of Communication with Friends and Family: Not on file  . Frequency of Social Gatherings with Friends and Family: Not on file  . Attends Religious Services: Not on file  . Active Member of Clubs or Organizations: Not on file  . Attends Meetings: Not on file  . Marital Status: Not on file  Intimate Partner Violence:   . Fear of Current or Ex-Partner: Not on file  . Emotionally Abused: Not on file  .  Physically Abused: Not on file  . Sexually Abused: Not on file   Family History  Problem Relation Age of Onset  . Hypertension Mother   . Depression Mother   . Diabetes Father   . Alcohol abuse Father   . Anxiety disorder Father   . Depression Father   . Alcohol abuse Sister   . Alcohol abuse Brother   . Drug abuse Brother    Current Outpatient Medications on File Prior to Visit  Medication Sig  . acetaminophen (TYLENOL) 500 MG tablet Take 500 mg by mouth every 6 (six) hours as needed.  Marland Kitchen esomeprazole (NEXIUM) 20 MG capsule Take 20 mg by mouth daily.   Marland Kitchen FLUoxetine (PROZAC) 40 MG capsule Take 2 capsules (80 mg total) by mouth daily.  . hydrOXYzine (ATARAX/VISTARIL) 10 MG tablet Take 1 tablet (10 mg total) by mouth 3 (three) times daily as needed for anxiety.   No current  facility-administered medications on file prior to visit.    Per HPI unless specifically indicated above     Objective:    BP 130/68 (BP Location: Left Arm, Patient Position: Sitting, Cuff Size: Normal)   Pulse 85   Temp 98.4 F (36.9 C) (Oral)   Resp 18   Ht 5\' 9"  (1.753 m)   Wt 185 lb (83.9 kg)   LMP 04/07/2019   SpO2 100%   BMI 27.32 kg/m   Wt Readings from Last 3 Encounters:  12/06/19 185 lb (83.9 kg)  10/05/19 189 lb (85.7 kg)  03/29/18 178 lb 6.4 oz (80.9 kg)    Physical Exam Vitals reviewed.  Constitutional:      General: She is not in acute distress.    Appearance: Normal appearance. She is well-developed, well-groomed and overweight. She is not ill-appearing or toxic-appearing.  HENT:     Head: Normocephalic and atraumatic.     Right Ear: Tympanic membrane, ear canal and external ear normal. There is no impacted cerumen.     Left Ear: Tympanic membrane, ear canal and external ear normal. There is no impacted cerumen.     Nose: Nose normal. No congestion or rhinorrhea.     Mouth/Throat:     Lips: Pink.     Mouth: Mucous membranes are moist.     Pharynx: Oropharynx is clear. Uvula midline. No oropharyngeal exudate or posterior oropharyngeal erythema.  Eyes:     General: Lids are normal. Vision grossly intact. No scleral icterus.       Right eye: No discharge.        Left eye: No discharge.     Extraocular Movements: Extraocular movements intact.     Conjunctiva/sclera: Conjunctivae normal.     Pupils: Pupils are equal, round, and reactive to light.  Neck:     Thyroid: No thyroid mass or thyromegaly.  Cardiovascular:     Rate and Rhythm: Normal rate and regular rhythm.     Pulses: Normal pulses.          Dorsalis pedis pulses are 2+ on the right side and 2+ on the left side.     Heart sounds: Normal heart sounds. No murmur heard.  No friction rub. No gallop.   Pulmonary:     Effort: Pulmonary effort is normal. No respiratory distress.     Breath sounds:  Normal breath sounds.  Abdominal:     General: Abdomen is flat. Bowel sounds are normal. There is no distension.     Palpations: Abdomen is soft. There is no hepatomegaly, splenomegaly  or mass.     Tenderness: There is no abdominal tenderness. There is no guarding or rebound.     Hernia: No hernia is present.  Musculoskeletal:        General: Normal range of motion.     Cervical back: Normal range of motion and neck supple. No tenderness.     Right lower leg: No edema.     Left lower leg: No edema.     Comments: Normal tone, strength 5/5 BUE & BLE  Feet:     Right foot:     Skin integrity: Skin integrity normal.     Left foot:     Skin integrity: Skin integrity normal.  Lymphadenopathy:     Cervical: No cervical adenopathy.  Skin:    General: Skin is warm and dry.     Capillary Refill: Capillary refill takes less than 2 seconds.     Findings: Rash present.     Comments: Large circular rash with central clearing under left breast, yeast like in appearance.  No satellite lesions noted  Neurological:     General: No focal deficit present.     Mental Status: She is alert and oriented to person, place, and time.     Cranial Nerves: No cranial nerve deficit.     Sensory: No sensory deficit.     Motor: No weakness.     Coordination: Coordination normal.     Gait: Gait normal.     Deep Tendon Reflexes: Reflexes normal.  Psychiatric:        Attention and Perception: Attention and perception normal.        Mood and Affect: Mood and affect normal.        Speech: Speech normal.        Behavior: Behavior normal. Behavior is cooperative.        Thought Content: Thought content normal.        Cognition and Memory: Cognition and memory normal.        Judgment: Judgment normal.     Results for orders placed or performed in visit on 02/18/15  HM MAMMOGRAPHY  Result Value Ref Range   HM Mammogram normal   HM PAP SMEAR  Result Value Ref Range   HM Pap smear normal       Assessment &  Plan:   Problem List Items Addressed This Visit      Musculoskeletal and Integument   Tinea corporis    Has treated with topical OTC cream in the past without relief of symptoms.  Has been dealing this this rash for > 1 year on/off.  Discussed diflucan in combination with clotrimazole-betamethasone cream applying 2x per day, allowing for a barrier between rash and bra, washing any bras in contact with this skin in hot water and drying on high heat to prevent re-infection.  Plan: 1. Begin diflucan 150mg  tablet today and repeat in 72 hours 2. Begin clotrimazole-betamethasone 1 application twice daily for the next 10-14 days 3. Be sure to wash hands well before/after applying medicated cream to under left breast 4. RTC if symptoms worsen or fail to improve      Relevant Medications   fluconazole (DIFLUCAN) 150 MG tablet   clotrimazole-betamethasone (LOTRISONE) cream     Other   Anxiety and depression    Currently stable and well controlled anxiety/depression.  Taking fluoxetine 80mg  daily and hydroxyzine 10mg  TID PRN with good symptom management.  Plan: 1. Continue fluoxetine 80mg  daily 2. Continue hydroxyzine 10mg  TID PRN  for anxiety 3. RTC in 6 months for follow up      Annual physical exam - Primary    Annual physical exam with new findings of tinea corporis under left breast.  Well adult with acute concerns of restless leg syndrome.  Plan: 1. Obtain health maintenance screenings as above according to age. - Increase physical activity to 30 minutes most days of the week.  - Eat healthy diet high in vegetables and fruits; low in refined carbohydrates. - Screening labs and tests as ordered -Screening mammogram declined, requesting to start after 50 2. Return 1 year for annual physical.       Relevant Orders   POCT Urinalysis Dipstick   CBC with Differential   COMPLETE METABOLIC PANEL WITH GFR   Screening for cervical cancer    Last PAP testing not on file.  Due for PAP  testing.  Plan: 1. PAP and HPV testing completed and sent to lab for evaluation       Relevant Orders   Cytology - PAP   Restless leg syndrome    Reports intermittent RLS that have interfered with sleep.  Denies this happening nightly but does happen frequently throughout the week.  Discussed starting low dose Requip 1mg  nightly when symptoms are present to help with reduction of RLS symptoms.  Patient interested in trial of medication.  Plan: 1. Begin Requip 1mg  nightly, as needed for RLS 2. Can titrate up to 2mg  nightly, if 1mg  is not adequately controlling symptoms 3. RTC in 4 weeks for re-evaluation      Relevant Medications   rOPINIRole (REQUIP) 2 MG tablet    Other Visit Diagnoses    Long-term use of high-risk medication       Relevant Orders   Lipid Profile   Weight gain       Relevant Orders   TSH + free T4      Meds ordered this encounter  Medications  . fluconazole (DIFLUCAN) 150 MG tablet    Sig: Take 1 tablet now and repeat dose in 72 hours if having continued symptoms.    Dispense:  2 tablet    Refill:  0  . clotrimazole-betamethasone (LOTRISONE) cream    Sig: Apply 1 application topically 2 (two) times daily.    Dispense:  30 g    Refill:  0  . rOPINIRole (REQUIP) 2 MG tablet    Sig: Take 0.5 tablets (1 mg total) by mouth at bedtime.    Dispense:  90 tablet    Refill:  1    Follow up plan: Return in about 4 weeks (around 01/03/2020) for Restless Leg Syndrome F/U visit.  Charlaine DaltonNicole Marie Miara Emminger, FNP-C Family Nurse Practitioner [facility] 12/06/2019, 12:43 PM

## 2019-12-06 NOTE — Assessment & Plan Note (Signed)
Currently stable and well controlled anxiety/depression.  Taking fluoxetine 80mg  daily and hydroxyzine 10mg  TID PRN with good symptom management.  Plan: 1. Continue fluoxetine 80mg  daily 2. Continue hydroxyzine 10mg  TID PRN for anxiety 3. RTC in 6 months for follow up

## 2019-12-06 NOTE — Assessment & Plan Note (Addendum)
Annual physical exam with new findings of tinea corporis under left breast.  Well adult with acute concerns of restless leg syndrome.  Plan: 1. Obtain health maintenance screenings as above according to age. - Increase physical activity to 30 minutes most days of the week.  - Eat healthy diet high in vegetables and fruits; low in refined carbohydrates. - Screening labs and tests as ordered -Screening mammogram declined, requesting to start after 50 2. Return 1 year for annual physical.

## 2019-12-07 ENCOUNTER — Other Ambulatory Visit: Payer: Self-pay | Admitting: Family Medicine

## 2019-12-07 DIAGNOSIS — E782 Mixed hyperlipidemia: Secondary | ICD-10-CM | POA: Insufficient documentation

## 2019-12-07 DIAGNOSIS — E781 Pure hyperglyceridemia: Secondary | ICD-10-CM | POA: Insufficient documentation

## 2019-12-07 LAB — COMPLETE METABOLIC PANEL WITH GFR
AG Ratio: 1.8 (calc) (ref 1.0–2.5)
ALT: 16 U/L (ref 6–29)
AST: 16 U/L (ref 10–35)
Albumin: 4.8 g/dL (ref 3.6–5.1)
Alkaline phosphatase (APISO): 62 U/L (ref 31–125)
BUN: 13 mg/dL (ref 7–25)
CO2: 27 mmol/L (ref 20–32)
Calcium: 10.2 mg/dL (ref 8.6–10.2)
Chloride: 102 mmol/L (ref 98–110)
Creat: 0.93 mg/dL (ref 0.50–1.10)
GFR, Est African American: 84 mL/min/{1.73_m2} (ref >=60)
GFR, Est Non African American: 72 mL/min/{1.73_m2} (ref >=60)
Globulin: 2.6 g/dL (calc) (ref 1.9–3.7)
Glucose, Bld: 101 mg/dL — ABNORMAL HIGH (ref 65–99)
Potassium: 4.8 mmol/L (ref 3.5–5.3)
Sodium: 139 mmol/L (ref 135–146)
Total Bilirubin: 0.7 mg/dL (ref 0.2–1.2)
Total Protein: 7.4 g/dL (ref 6.1–8.1)

## 2019-12-07 LAB — CBC WITH DIFFERENTIAL/PLATELET
Absolute Monocytes: 455 cells/uL (ref 200–950)
Basophils Absolute: 41 cells/uL (ref 0–200)
Basophils Relative: 0.6 %
Eosinophils Absolute: 262 cells/uL (ref 15–500)
Eosinophils Relative: 3.8 %
HCT: 44.3 % (ref 35.0–45.0)
Hemoglobin: 15.1 g/dL (ref 11.7–15.5)
Lymphs Abs: 1194 cells/uL (ref 850–3900)
MCH: 31.7 pg (ref 27.0–33.0)
MCHC: 34.1 g/dL (ref 32.0–36.0)
MCV: 93.1 fL (ref 80.0–100.0)
MPV: 9.8 fL (ref 7.5–12.5)
Monocytes Relative: 6.6 %
Neutro Abs: 4947 cells/uL (ref 1500–7800)
Neutrophils Relative %: 71.7 %
Platelets: 297 10*3/uL (ref 140–400)
RBC: 4.76 10*6/uL (ref 3.80–5.10)
RDW: 12.1 % (ref 11.0–15.0)
Total Lymphocyte: 17.3 %
WBC: 6.9 10*3/uL (ref 3.8–10.8)

## 2019-12-07 LAB — LIPID PANEL
Cholesterol: 296 mg/dL — ABNORMAL HIGH (ref <200)
HDL: 42 mg/dL — ABNORMAL LOW (ref >=50)
LDL Cholesterol (Calc): 207 mg/dL (calc) — ABNORMAL HIGH
Non-HDL Cholesterol (Calc): 254 mg/dL (calc) — ABNORMAL HIGH (ref <130)
Total CHOL/HDL Ratio: 7 (calc) — ABNORMAL HIGH (ref <5.0)
Triglycerides: 261 mg/dL — ABNORMAL HIGH (ref <150)

## 2019-12-07 LAB — TSH+FREE T4: TSH W/REFLEX TO FT4: 1.02 mIU/L

## 2019-12-07 MED ORDER — EZETIMIBE 10 MG PO TABS: 10.0000 mg | ORAL_TABLET | Freq: Every day | ORAL | 1 refills | Status: DC

## 2019-12-07 MED ORDER — ATORVASTATIN CALCIUM 10 MG PO TABS: 10.0000 mg | ORAL_TABLET | Freq: Every day | ORAL | 3 refills | Status: DC

## 2019-12-11 LAB — CYTOLOGY - PAP
Comment: NEGATIVE
Diagnosis: NEGATIVE
High risk HPV: NEGATIVE

## 2019-12-29 ENCOUNTER — Other Ambulatory Visit: Payer: Self-pay | Admitting: Family Medicine

## 2019-12-29 DIAGNOSIS — F410 Panic disorder [episodic paroxysmal anxiety] without agoraphobia: Secondary | ICD-10-CM

## 2019-12-29 DIAGNOSIS — F419 Anxiety disorder, unspecified: Secondary | ICD-10-CM

## 2020-01-03 ENCOUNTER — Ambulatory Visit: Payer: 59 | Admitting: Family Medicine

## 2020-07-21 ENCOUNTER — Ambulatory Visit
Admission: EM | Admit: 2020-07-21 | Discharge: 2020-07-21 | Disposition: A | Discharge status: Home / Self Care | Payer: Commercial Managed Care - PPO | Attending: Physician Assistant | Admitting: Physician Assistant

## 2020-07-21 ENCOUNTER — Encounter: Payer: Self-pay | Admitting: Emergency Medicine

## 2020-07-21 ENCOUNTER — Other Ambulatory Visit: Payer: Self-pay

## 2020-07-21 DIAGNOSIS — R0981 Nasal congestion: Secondary | ICD-10-CM

## 2020-07-21 DIAGNOSIS — J069 Acute upper respiratory infection, unspecified: Secondary | ICD-10-CM | POA: Diagnosis not present

## 2020-07-21 MED ORDER — PSEUDOEPH-BROMPHEN-DM 30-2-10 MG/5ML PO SYRP: 10.0000 mL | ORAL_SOLUTION | Freq: Four times a day (QID) | ORAL | 0 refills | Status: AC | PRN

## 2020-07-21 MED ORDER — IPRATROPIUM BROMIDE 0.06 % NA SOLN: 2.0000 | Freq: Four times a day (QID) | NASAL | 12 refills | Status: DC

## 2020-07-21 NOTE — ED Provider Notes (Signed)
[facility]    CSN: [id number] Arrival date & time: 07/21/20  0840      History   Chief Complaint Chief Complaint  Patient presents with  . Nasal Congestion    HPI Tasha Ferguson is a 50 y.o. female presenting for ~1 week history of nasal congestion, postnasal drainage, sinus pressure and headaches.  Patient states that over the past day she started to have bilateral ear pain and pressure.  Patient believes she may have a sinus infection.  She has been taking Tylenol and no other OTC medications.  She denies any fever, fatigue, body aches, cough, chest pain, shortness of breath, nausea/vomiting or diarrhea.  No COVID exposure.  She denies any history of COPD or asthma.  She is otherwise healthy.  She has no other concerns today.  HPI  Past Medical History:  Diagnosis Date  . Anxiety   . Breast mass in female    R side calcifiction  . Depression   . Mass    adernal gland being followed by urologist    Patient Active Problem List   Diagnosis Date Noted  . Mixed hyperlipidemia 12/07/2019  . Hypertriglyceridemia 12/07/2019  . Annual physical exam 12/06/2019  . Screening for cervical cancer 12/06/2019  . Tinea corporis 12/06/2019  . Restless leg syndrome 12/06/2019  . Left adrenal mass (HCC) 10/05/2019  . Panic attack 02/24/2017  . Anxiety and depression 02/18/2015    Past Surgical History:  Procedure Laterality Date  . CESAREAN SECTION     1994, 1998    OB History   No obstetric history on file.      Home Medications    Prior to Admission medications   Medication Sig Start Date End Date Taking? Authorizing Provider  acetaminophen (TYLENOL) 500 MG tablet Take 500 mg by mouth every 6 (six) hours as needed.   Yes [provider]  atorvastatin (LIPITOR) 10 MG tablet Take 1 tablet (10 mg total) by mouth daily. 12/07/19  Yes Malfi, Jodelle Gross, FNP  brompheniramine-pseudoephedrine-DM 30-2-10 MG/5ML syrup Take 10 mLs by mouth 4 (four) times daily as  needed for up to 7 days. 07/21/20 07/28/20 Yes Shirlee Latch, PA-C  ezetimibe (ZETIA) 10 MG tablet Take 1 tablet (10 mg total) by mouth daily. 12/07/19  Yes Malfi, Jodelle Gross, FNP  FLUoxetine (PROZAC) 40 MG capsule TAKE 2 CAPSULES BY MOUTH DAILY 12/29/19  Yes Malfi, Jodelle Gross, FNP  hydrOXYzine (ATARAX/VISTARIL) 10 MG tablet Take 1 tablet (10 mg total) by mouth 3 (three) times daily as needed for anxiety. 10/05/19  Yes Malfi, Jodelle Gross, FNP  ipratropium (ATROVENT) 0.06 % nasal spray Place 2 sprays into both nostrils 4 (four) times daily. 07/21/20  Yes Shirlee Latch, PA-C  rOPINIRole (REQUIP) 2 MG tablet Take 0.5 tablets (1 mg total) by mouth at bedtime. 12/06/19  Yes Malfi, Jodelle Gross, FNP  clotrimazole-betamethasone (LOTRISONE) cream Apply 1 application topically 2 (two) times daily. 12/06/19   Malfi, Jodelle Gross, FNP  esomeprazole (NEXIUM) 20 MG capsule Take 20 mg by mouth daily.     [provider]  fluconazole (DIFLUCAN) 150 MG tablet Take 1 tablet now and repeat dose in 72 hours if having continued symptoms. 12/06/19   Malfi, Jodelle Gross, FNP    Family History Family History  Problem Relation Age of Onset  . Hypertension Mother   . Depression Mother   . Diabetes Father   . Alcohol abuse Father   . Anxiety disorder Father   . Depression  Father   . Alcohol abuse Sister   . Alcohol abuse Brother   . Drug abuse Brother     Social History Social History   Tobacco Use  . Smoking status: Former Smoker    Types: E-cigarettes  . Smokeless tobacco: Never Used  Vaping Use  . Vaping Use: Every day  . Substances: Nicotine  Substance Use Topics  . Alcohol use: No    Alcohol/week: 0.0 standard drinks  . Drug use: No     Allergies   Codeine   Review of Systems Review of Systems  Constitutional: Negative for chills, diaphoresis, fatigue and fever.  HENT: Positive for congestion, rhinorrhea, sinus pressure and sinus pain. Negative for ear pain and sore throat.   Respiratory: Negative for  cough and shortness of breath.   Gastrointestinal: Negative for abdominal pain, nausea and vomiting.  Musculoskeletal: Negative for arthralgias and myalgias.  Skin: Negative for rash.  Neurological: Positive for headaches. Negative for weakness.  Hematological: Negative for adenopathy.     Physical Exam Triage Vital Signs ED Triage Vitals [07/21/20 0850]  Enc Vitals Group     BP 139/88     Pulse Rate 92     Resp 18     Temp 98.6 F (37 C)     Temp Source Oral     SpO2 100 %     Weight 184 lb 15.5 oz (83.9 kg)     Height 5\' 9"  (1.753 m)     Head Circumference      Peak Flow      Pain Score 5     Pain Loc      Pain Edu?      Excl. in GC?    No data found.  Updated Vital Signs BP 139/88 (BP Location: Right Arm)   Pulse 92   Temp 98.6 F (37 C) (Oral)   Resp 18   Ht 5\' 9"  (1.753 m)   Wt 184 lb 15.5 oz (83.9 kg)   LMP 04/07/2019   SpO2 100%   BMI 27.31 kg/m      Physical Exam Vitals and nursing note reviewed.  Constitutional:      General: She is not in acute distress.    Appearance: Normal appearance. She is not ill-appearing or toxic-appearing.  HENT:     Head: Normocephalic and atraumatic.     Right Ear: Ear canal and external ear normal. A middle ear effusion is present.     Left Ear: Ear canal and external ear normal. A middle ear effusion is present.     Nose: Congestion and rhinorrhea present.     Mouth/Throat:     Mouth: Mucous membranes are moist.     Pharynx: Oropharynx is clear. No posterior oropharyngeal erythema.  Eyes:     General: No scleral icterus.       Right eye: No discharge.        Left eye: No discharge.     Conjunctiva/sclera: Conjunctivae normal.  Cardiovascular:     Rate and Rhythm: Normal rate and regular rhythm.     Heart sounds: Normal heart sounds.  Pulmonary:     Effort: Pulmonary effort is normal. No respiratory distress.     Breath sounds: Normal breath sounds.  Musculoskeletal:     Cervical back: Neck supple.  Skin:     General: Skin is dry.  Neurological:     General: No focal deficit present.     Mental Status: She is alert. Mental status is  at baseline.     Motor: No weakness.     Gait: Gait normal.  Psychiatric:        Mood and Affect: Mood normal.        Behavior: Behavior normal.        Thought Content: Thought content normal.      UC Treatments / Results  Labs (all labs ordered are listed, but only abnormal results are displayed) Labs Reviewed - No data to display  EKG   Radiology No results found.  Procedures Procedures (including critical care time)  Medications Ordered in UC Medications - No data to display  Initial Impression / Assessment and Plan / UC Course  I have reviewed the triage vital signs and the nursing notes.  Pertinent labs & imaging results that were available during my care of the patient were reviewed by me and considered in my medical decision making (see chart for details).   50 y/o female presenting with ~1 week history of nasal congestion and sinus pressure.  Vital signs are all stable and normal.  Clinical presentation is most consistent with a viral URI.  Patient has not tried any decongestants so I advised her that she should try decongestants and if symptoms are still ongoing over the next week then antibiotics can be considered.  I have sent in Atrovent nasal spray and Bromfed.  Encouraged increasing rest and fluids.  Again, advised to follow-up if symptoms are worsening or not improving over the next week.  Patient declined a work note in the AVS.   Final Clinical Impressions(s) / UC Diagnoses   Final diagnoses:  Viral upper respiratory tract infection  Nasal congestion     Discharge Instructions     URI/COLD SYMPTOMS: Your exam today is consistent with a viral illness. Antibiotics are not indicated at this time. Use medications as directed, including cough syrup, nasal saline, and decongestants. Your symptoms should improve over the next few  days and resolve within 7-10 days. Increase rest and fluids. F/u if symptoms worsen or predominate such as sore throat, ear pain, productive cough, shortness of breath, or if you develop high fevers or worsening fatigue over the next several days.      ED Prescriptions    Medication Sig Dispense Auth. Provider   ipratropium (ATROVENT) 0.06 % nasal spray Place 2 sprays into both nostrils 4 (four) times daily. 15 mL Eusebio Friendly B, PA-C   brompheniramine-pseudoephedrine-DM 30-2-10 MG/5ML syrup Take 10 mLs by mouth 4 (four) times daily as needed for up to 7 days. 150 mL Shirlee Latch, PA-C     PDMP not reviewed this encounter.   Shirlee Latch, PA-C 07/21/20 [phone]

## 2020-07-21 NOTE — Discharge Instructions (Addendum)
URI/COLD SYMPTOMS: Your exam today is consistent with a viral illness. Antibiotics are not indicated at this time. Use medications as directed, including cough syrup, nasal saline, and decongestants. Your symptoms should improve over the next few days and resolve within 7-10 days. Increase rest and fluids. F/u if symptoms worsen or predominate such as sore throat, ear pain, productive cough, shortness of breath, or if you develop high fevers or worsening fatigue over the next several days.    

## 2020-07-21 NOTE — ED Triage Notes (Signed)
Pt c/o nasal congestion, sinus pain/pressure, headache. Started about a week ago. Denies fever or cough.

## 2020-08-15 ENCOUNTER — Ambulatory Visit: Payer: Self-pay | Admitting: *Deleted

## 2020-08-15 NOTE — Telephone Encounter (Signed)
Recommend in office appt for evaluation. Schedule for 8 am Monday

## 2020-08-15 NOTE — Telephone Encounter (Signed)
Per initial encounter, "Pt went to urgent care 2 wks ago and still having right ear pain with pressure on right side of her throat that is radiating to her jaw. Please advise. Pt is request in office appt today. Rene Kocher has only one virtual appt left"; contacted pt to discuss symptoms; she was seent in Urgent Care 07/21/20; the pt has continued right ear tightness with drainage down throat; she says it is affecting her jaw (tightness, ear hurts when opens mouth, jaw clicking, feels like ear is being pulled); pain rated 7 out of 10; she has been taking Acetaminophen 1000 mg every 6 hours for pain; recommendations made per nurse triage protocol; decision tree completed; the pt declined virtual appt with Nicki Reaper, [facility], 08/15/20 at 1620; the pt can be contacted at [phone]; will route to office for scheduling  Reason for Disposition . [1] SEVERE pain AND [2] not improved 2 hours after taking analgesic medication (e.g., ibuprofen or acetaminophen)  Answer Assessment - Initial Assessment Questions 1. LOCATION: "Which ear is involved?"     Right ear 2. ONSET: "When did the ear start hurting"      Seen at Urgent Care 07/21/20 3. SEVERITY: "How bad is the pain?"  (Scale 1-10; mild, moderate or severe)   - MILD (1-3): doesn't interfere with normal activities    - MODERATE (4-7): interferes with normal activities or awakens from sleep    - SEVERE (8-10): excruciating pain, unable to do any normal activities      7 out of 10 4. URI SYMPTOMS: "Do you have a runny nose or cough?"     no 5. FEVER: "Do you have a fever?" If Yes, ask: "What is your temperature, how was it measured, and when did it start?"     no 6. CAUSE: "Have you been swimming recently?", "How often do you use Q-TIPS?", "Have you had any recent air travel or scuba diving?"    no 7. OTHER SYMPTOMS: "Do you have any other symptoms?" (e.g., headache, stiff neck, dizziness, vomiting, runny nose, decreased hearing)    Headaches;  drainage down throat, sore throat, jaw discomfort (tightness,    ear hurts when opens mouth, jaw clicking, feels like ear is being pulled) 8. PREGNANCY: "Is there any chance you are pregnant?" "When was your last menstrual period?"      no  Protocols used: EARACHE-A-AH

## 2020-08-18 ENCOUNTER — Ambulatory Visit: Payer: Commercial Managed Care - PPO | Admitting: Internal Medicine

## 2020-08-18 NOTE — Progress Notes (Deleted)
Subjective:    Patient ID: Tasha Ferguson, female    DOB: Jan 27, 1971, 50 y.o.   MRN: [id number]  HPI  Patient presents to the clinic today with complaint of.  She was seen at urgent care 5/9 with similar symptoms.  She was diagnosed with a viral URI and treated with Atrovent nasal spray and Bromfed.  Review of Systems      Past Medical History:  Diagnosis Date  . Anxiety   . Breast mass in female    R side calcifiction  . Depression   . Mass    adernal gland being followed by urologist    Current Outpatient Medications  Medication Sig Dispense Refill  . acetaminophen (TYLENOL) 500 MG tablet Take 500 mg by mouth every 6 (six) hours as needed.    Marland Kitchen atorvastatin (LIPITOR) 10 MG tablet Take 1 tablet (10 mg total) by mouth daily. 90 tablet 3  . clotrimazole-betamethasone (LOTRISONE) cream Apply 1 application topically 2 (two) times daily. 30 g 0  . esomeprazole (NEXIUM) 20 MG capsule Take 20 mg by mouth daily.     Marland Kitchen ezetimibe (ZETIA) 10 MG tablet Take 1 tablet (10 mg total) by mouth daily. 90 tablet 1  . fluconazole (DIFLUCAN) 150 MG tablet Take 1 tablet now and repeat dose in 72 hours if having continued symptoms. 2 tablet 0  . FLUoxetine (PROZAC) 40 MG capsule TAKE 2 CAPSULES BY MOUTH DAILY 180 capsule 1  . hydrOXYzine (ATARAX/VISTARIL) 10 MG tablet Take 1 tablet (10 mg total) by mouth 3 (three) times daily as needed for anxiety. 40 tablet 0  . ipratropium (ATROVENT) 0.06 % nasal spray Place 2 sprays into both nostrils 4 (four) times daily. 15 mL 12  . rOPINIRole (REQUIP) 2 MG tablet Take 0.5 tablets (1 mg total) by mouth at bedtime. 90 tablet 1   No current facility-administered medications for this visit.    Allergies  Allergen Reactions  . Codeine Nausea Only    Nausea and vomiting    Family History  Problem Relation Age of Onset  . Hypertension Mother   . Depression Mother   . Diabetes Father   . Alcohol abuse Father   . Anxiety disorder Father   . Depression  Father   . Alcohol abuse Sister   . Alcohol abuse Brother   . Drug abuse Brother     Social History   Socioeconomic History  . Marital status: Married    Spouse name: Not on file  . Number of children: Not on file  . Years of education: Not on file  . Highest education level: Not on file  Occupational History  . Not on file  Tobacco Use  . Smoking status: Former Smoker    Types: E-cigarettes  . Smokeless tobacco: Never Used  Vaping Use  . Vaping Use: Every day  . Substances: Nicotine  Substance and Sexual Activity  . Alcohol use: No    Alcohol/week: 0.0 standard drinks  . Drug use: No  . Sexual activity: Yes  Other Topics Concern  . Not on file  Social History Narrative  . Not on file   Social Determinants of Health   Financial Resource Strain: Not on file  Food Insecurity: Not on file  Transportation Needs: Not on file  Physical Activity: Not on file  Stress: Not on file  Social Connections: Not on file  Intimate Partner Violence: Not on file     Constitutional: Denies fever, malaise, fatigue, headache or abrupt weight  changes.  HEENT: Denies eye pain, eye redness, ear pain, ringing in the ears, wax buildup, runny nose, nasal congestion, bloody nose, or sore throat. Respiratory: Denies difficulty breathing, shortness of breath, cough or sputum production.   Cardiovascular: Denies chest pain, chest tightness, palpitations or swelling in the hands or feet.  Gastrointestinal: Denies abdominal pain, bloating, constipation, diarrhea or blood in the stool.  GU: Denies urgency, frequency, pain with urination, burning sensation, blood in urine, odor or discharge. Musculoskeletal: Denies decrease in range of motion, difficulty with gait, muscle pain or joint pain and swelling.  Skin: Denies redness, rashes, lesions or ulcercations.  Neurological: Denies dizziness, difficulty with memory, difficulty with speech or problems with balance and coordination.  Psych: Denies  anxiety, depression, SI/HI.  No other specific complaints in a complete review of systems (except as listed in HPI above).  Objective:   Physical Exam   LMP 04/07/2019  Wt Readings from Last 3 Encounters:  07/21/20 184 lb 15.5 oz (83.9 kg)  12/06/19 185 lb (83.9 kg)  10/05/19 189 lb (85.7 kg)    General: Appears their stated age, well developed, well nourished in NAD. Skin: Warm, dry and intact. No rashes, lesions or ulcerations noted. HEENT: Head: normal shape and size; Eyes: sclera white, no icterus, conjunctiva pink, PERRLA and EOMs intact; Ears: Tm's gray and intact, normal light reflex; Nose: mucosa pink and moist, septum midline; Throat/Mouth: Teeth present, mucosa pink and moist, no exudate, lesions or ulcerations noted.  Neck:  Neck supple, trachea midline. No masses, lumps or thyromegaly present.  Cardiovascular: Normal rate and rhythm. S1,S2 noted.  No murmur, rubs or gallops noted. No JVD or BLE edema. No carotid bruits noted. Pulmonary/Chest: Normal effort and positive vesicular breath sounds. No respiratory distress. No wheezes, rales or ronchi noted.  Abdomen: Soft and nontender. Normal bowel sounds. No distention or masses noted. Liver, spleen and kidneys non palpable. Musculoskeletal: Normal range of motion. No signs of joint swelling. No difficulty with gait.  Neurological: Alert and oriented. Cranial nerves II-XII grossly intact. Coordination normal.  Psychiatric: Mood and affect normal. Behavior is normal. Judgment and thought content normal.   EKG:  BMET    Component Value Date/Time   NA 139 12/06/2019 0938   K 4.8 12/06/2019 0938   CL 102 12/06/2019 0938   CO2 27 12/06/2019 0938   GLUCOSE 101 (H) 12/06/2019 0938   BUN 13 12/06/2019 0938   CREATININE 0.93 12/06/2019 0938   CALCIUM 10.2 12/06/2019 0938   GFRNONAA 72 12/06/2019 0938   GFRAA 84 12/06/2019 0938    Lipid Panel     Component Value Date/Time   CHOL 296 (H) 12/06/2019 0938   TRIG 261 (H)  12/06/2019 0938   HDL 42 (L) 12/06/2019 0938   CHOLHDL 7.0 (H) 12/06/2019 0938   LDLCALC 207 (H) 12/06/2019 0938    CBC    Component Value Date/Time   WBC 6.9 12/06/2019 0938   RBC 4.76 12/06/2019 0938   HGB 15.1 12/06/2019 0938   HCT 44.3 12/06/2019 0938   PLT 297 12/06/2019 0938   MCV 93.1 12/06/2019 0938   MCH 31.7 12/06/2019 0938   MCHC 34.1 12/06/2019 0938   RDW 12.1 12/06/2019 0938   LYMPHSABS 1,194 12/06/2019 0938   EOSABS 262 12/06/2019 0938   BASOSABS 41 12/06/2019 0938    Hgb A1C No results found for: HGBA1C        Assessment & Plan:     Nicki Reaper, NP This visit occurred during the  SARS-CoV-2 public health emergency.  Safety protocols were in place, including screening questions prior to the visit, additional usage of staff PPE, and extensive cleaning of exam room while observing appropriate contact time as indicated for disinfecting solutions.

## 2020-09-22 ENCOUNTER — Emergency Department: Payer: Commercial Managed Care - PPO

## 2020-09-22 ENCOUNTER — Emergency Department
Admission: EM | Admit: 2020-09-22 | Discharge: 2020-09-22 | Disposition: A | Discharge status: Home / Self Care | Payer: Commercial Managed Care - PPO | Attending: Emergency Medicine | Admitting: Emergency Medicine

## 2020-09-22 ENCOUNTER — Other Ambulatory Visit: Payer: Self-pay

## 2020-09-22 DIAGNOSIS — Z87891 Personal history of nicotine dependence: Secondary | ICD-10-CM | POA: Diagnosis not present

## 2020-09-22 DIAGNOSIS — R55 Syncope and collapse: Secondary | ICD-10-CM | POA: Diagnosis not present

## 2020-09-22 LAB — URINALYSIS, COMPLETE (UACMP) WITH MICROSCOPIC
Bilirubin Urine: NEGATIVE
Glucose, UA: NEGATIVE mg/dL
Hgb urine dipstick: NEGATIVE
Ketones, ur: NEGATIVE mg/dL
Leukocytes,Ua: NEGATIVE
Nitrite: NEGATIVE
Protein, ur: NEGATIVE mg/dL
Specific Gravity, Urine: 1.005 (ref 1.005–1.030)
pH: 6 (ref 5.0–8.0)

## 2020-09-22 LAB — CBC WITH DIFFERENTIAL/PLATELET
Abs Immature Granulocytes: 0.05 10*3/uL (ref 0.00–0.07)
Basophils Absolute: 0.1 10*3/uL (ref 0.0–0.1)
Basophils Relative: 1 %
Eosinophils Absolute: 0.2 10*3/uL (ref 0.0–0.5)
Eosinophils Relative: 2 %
HCT: 38.5 % (ref 36.0–46.0)
Hemoglobin: 13.4 g/dL (ref 12.0–15.0)
Immature Granulocytes: 1 %
Lymphocytes Relative: 22 %
Lymphs Abs: 2.2 10*3/uL (ref 0.7–4.0)
MCH: 32.4 pg (ref 26.0–34.0)
MCHC: 34.8 g/dL (ref 30.0–36.0)
MCV: 93 fL (ref 80.0–100.0)
Monocytes Absolute: 0.9 10*3/uL (ref 0.1–1.0)
Monocytes Relative: 9 %
Neutro Abs: 6.9 10*3/uL (ref 1.7–7.7)
Neutrophils Relative %: 65 %
Platelets: 275 10*3/uL (ref 150–400)
RBC: 4.14 MIL/uL (ref 3.87–5.11)
RDW: 12 % (ref 11.5–15.5)
WBC: 10.3 10*3/uL (ref 4.0–10.5)
nRBC: 0 % (ref 0.0–0.2)

## 2020-09-22 LAB — COMPREHENSIVE METABOLIC PANEL
ALT: 18 U/L (ref 0–44)
AST: 19 U/L (ref 15–41)
Albumin: 4 g/dL (ref 3.5–5.0)
Alkaline Phosphatase: 52 U/L (ref 38–126)
Anion gap: 8 (ref 5–15)
BUN: 12 mg/dL (ref 6–20)
CO2: 24 mmol/L (ref 22–32)
Calcium: 8.4 mg/dL — ABNORMAL LOW (ref 8.9–10.3)
Chloride: 106 mmol/L (ref 98–111)
Creatinine, Ser: 0.88 mg/dL (ref 0.44–1.00)
GFR, Estimated: >60 mL/min (ref >60)
Glucose, Bld: 123 mg/dL — ABNORMAL HIGH (ref 70–99)
Potassium: 3.7 mmol/L (ref 3.5–5.1)
Sodium: 138 mmol/L (ref 135–145)
Total Bilirubin: 0.6 mg/dL (ref 0.3–1.2)
Total Protein: 6.6 g/dL (ref 6.5–8.1)

## 2020-09-22 LAB — TROPONIN I (HIGH SENSITIVITY): Troponin I (High Sensitivity): <2 ng/L (ref <18)

## 2020-09-22 MED ORDER — LACTATED RINGERS IV BOLUS
1000.0000 mL | Freq: Once | INTRAVENOUS | Status: AC
  Administered 2020-09-22: 1000 mL via INTRAVENOUS

## 2020-09-22 NOTE — ED Provider Notes (Signed)
[facility] Emergency Department Provider Note  ____________________________________________   Event Date/Time   First MD Initiated Contact with Patient 09/22/20 1113     (approximate)  I have reviewed the triage vital signs and the nursing notes.   HISTORY  Chief Complaint Loss of Consciousness   HPI Tasha Ferguson is a 50 y.o. female with a past medical history of anxiety, depression, and adrenal [place] as well as RLS and recurrent syncopal episodes going back several years who presents for assessment after a syncopal episode that occurred earlier today.  Patient states he was standing at a car dealership when she felt extremely lightheaded and a little nauseous and warm and felt her self pass out.  She states she hit her head against a brick wall but did not hit anywhere else and has a little bit of right-sided headache where she hit her head but no other acute pain.  She does not think she hit anywhere else has no pain specifically in her arms, legs, neck, back, chest or abdomen.  Does not recall any recent sick symptoms including vision changes, chest pain, cough, shortness of breath, nausea, vomiting, diarrhea, dysuria, rash or recent injuries or falls.  She denies EtOH or illicit drug use.  States she had multiple episodes like this in the past which she thought may be related to low blood sugar although she has never been diagnosed with diabetes and does not take insulin.  She states he only had a little coffee this morning but no other significant food or p.o. intake.         Past Medical History:  Diagnosis Date   Anxiety    Breast mass in female    R side calcifiction   Depression    Mass    adernal gland being followed by urologist    Patient Active Problem List   Diagnosis Date Noted   Mixed hyperlipidemia 12/07/2019   Restless leg syndrome 12/06/2019   Left adrenal mass (HCC) 10/05/2019   Panic attack 02/24/2017   Anxiety and depression  02/18/2015    Past Surgical History:  Procedure Laterality Date   CESAREAN SECTION     1994, 1998    Prior to Admission medications   Medication Sig Start Date End Date Taking? Authorizing Provider  acetaminophen (TYLENOL) 500 MG tablet Take 500 mg by mouth every 6 (six) hours as needed.    [provider]  atorvastatin (LIPITOR) 10 MG tablet Take 1 tablet (10 mg total) by mouth daily. 12/07/19   Malfi, Jodelle Gross, FNP  clotrimazole-betamethasone (LOTRISONE) cream Apply 1 application topically 2 (two) times daily. 12/06/19   Malfi, Jodelle Gross, FNP  esomeprazole (NEXIUM) 20 MG capsule Take 20 mg by mouth daily.     [provider]  ezetimibe (ZETIA) 10 MG tablet Take 1 tablet (10 mg total) by mouth daily. 12/07/19   Malfi, Jodelle Gross, FNP  fluconazole (DIFLUCAN) 150 MG tablet Take 1 tablet now and repeat dose in 72 hours if having continued symptoms. 12/06/19   Tarri Fuller, FNP  FLUoxetine (PROZAC) 40 MG capsule TAKE 2 CAPSULES BY MOUTH DAILY 12/29/19   Malfi, Jodelle Gross, FNP  hydrOXYzine (ATARAX/VISTARIL) 10 MG tablet Take 1 tablet (10 mg total) by mouth 3 (three) times daily as needed for anxiety. 10/05/19   Malfi, Jodelle Gross, FNP  ipratropium (ATROVENT) 0.06 % nasal spray Place 2 sprays into both nostrils 4 (four) times daily. 07/21/20   Shirlee Latch, PA-C  rOPINIRole (REQUIP) 2 MG tablet Take 0.5 tablets (1 mg total) by mouth at bedtime. 12/06/19   Malfi, Jodelle Gross, FNP    Allergies Codeine  Family History  Problem Relation Age of Onset   Hypertension Mother    Depression Mother    Diabetes Father    Alcohol abuse Father    Anxiety disorder Father    Depression Father    Alcohol abuse Sister    Alcohol abuse Brother    Drug abuse Brother     Social History Social History   Tobacco Use   Smoking status: Former    Pack years: 0.00    Types: E-cigarettes   Smokeless tobacco: Never  Building services engineer Use: Every day   Substances: Nicotine  Substance Use Topics    Alcohol use: No    Alcohol/week: 0.0 standard drinks   Drug use: No    Review of Systems  Review of Systems  Constitutional:  Negative for chills and fever.  HENT:  Negative for sore throat.   Eyes:  Negative for pain.  Respiratory:  Negative for cough and stridor.   Cardiovascular:  Negative for chest pain.  Gastrointestinal:  Negative for vomiting.  Genitourinary:  Negative for dysuria.  Musculoskeletal:  Negative for myalgias.  Skin:  Negative for rash.  Neurological:  Positive for dizziness and headaches. Negative for seizures and loss of consciousness.  Psychiatric/Behavioral:  Negative for suicidal ideas.   All other systems reviewed and are negative.    ____________________________________________   PHYSICAL EXAM:  VITAL SIGNS: ED Triage Vitals  Enc Vitals Group     BP      Pulse      Resp      Temp      Temp src      SpO2      Weight      Height      Head Circumference      Peak Flow      Pain Score      Pain Loc      Pain Edu?      Excl. in GC?    Vitals:   09/22/20 1300 09/22/20 1330  BP: 127/69 127/66  Pulse: 80 76  Resp: (!) 21 18  Temp:  98 F (36.7 C)  SpO2: 100% 100%   Physical Exam Vitals and nursing note reviewed.  Constitutional:      General: She is not in acute distress.    Appearance: She is well-developed.  HENT:     Head: Normocephalic and atraumatic.     Right Ear: External ear normal.     Left Ear: External ear normal.     Nose: Nose normal.  Eyes:     Conjunctiva/sclera: Conjunctivae normal.  Cardiovascular:     Rate and Rhythm: Normal rate and regular rhythm.     Heart sounds: No murmur heard. Pulmonary:     Effort: Pulmonary effort is normal. No respiratory distress.     Breath sounds: Normal breath sounds.  Abdominal:     Palpations: Abdomen is soft.     Tenderness: There is no abdominal tenderness.  Musculoskeletal:     Cervical back: Neck supple.  Skin:    General: Skin is warm and dry.     Capillary  Refill: Capillary refill takes 2 to 3 seconds.  Neurological:     Mental Status: She is alert and oriented to person, place, and time.    Cranial nerves II through XII grossly  intact.  No pronator drift.  No finger dysmetria.  Symmetric 5/5 strength of all extremities.  Sensation intact to light touch in all extremities.  Unremarkable unassisted gait.  Approximately 0.25 cm linear hemostatic lack just over the right eyebrow. ____________________________________________   LABS (all labs ordered are listed, but only abnormal results are displayed)  Labs Reviewed  COMPREHENSIVE METABOLIC PANEL - Abnormal; Notable for the following components:      Result Value   Glucose, Bld 123 (*)    Calcium 8.4 (*)    All other components within normal limits  URINALYSIS, COMPLETE (UACMP) WITH MICROSCOPIC - Abnormal; Notable for the following components:   Color, Urine STRAW (*)    APPearance CLEAR (*)    Bacteria, UA RARE (*)    All other components within normal limits  CBC WITH DIFFERENTIAL/PLATELET  TROPONIN I (HIGH SENSITIVITY)  TROPONIN I (HIGH SENSITIVITY)   ____________________________________________  EKG  Sinus rhythm with ventricular rate of 65, normal axis, artifact versus nonspecific change in lead III without other clear evidence of acute ischemia or significant arrhythmia. ____________________________________________  RADIOLOGY  ED MD interpretation: CT head shows no evidence of skull fracture or any other acute cranial process.  Chest x-ray shows no evidence of focal consolidation, effusion, edema, pneumothorax or other clear acute intrathoracic process.  Official radiology report(s): DG Chest 2 View  Result Date: 09/22/2020 CLINICAL DATA:  Syncope EXAM: CHEST - 2 VIEW COMPARISON:  07/14/2013 FINDINGS: The heart size and mediastinal contours are within normal limits. Both lungs are clear. The visualized skeletal structures are unremarkable. IMPRESSION: Negative. Electronically  Signed   By: Charlett Nose M.D.   On: 09/22/2020 12:16   CT Head Wo Contrast  Result Date: 09/22/2020 CLINICAL DATA:  Syncope, hit head. EXAM: CT HEAD WITHOUT CONTRAST TECHNIQUE: Contiguous axial images were obtained from the base of the skull through the vertex without intravenous contrast. COMPARISON:  None. FINDINGS: Brain: No acute intracranial abnormality. Specifically, no hemorrhage, hydrocephalus, mass lesion, acute infarction, or significant intracranial injury. Vascular: No hyperdense vessel or unexpected calcification. Skull: No acute calvarial abnormality. Sinuses/Orbits: No acute findings Other: None IMPRESSION: No acute intracranial abnormality. Electronically Signed   By: Charlett Nose M.D.   On: 09/22/2020 12:17    ____________________________________________   PROCEDURES  Procedure(s) performed (including Critical Care):  .1-3 Lead EKG Interpretation  Date/Time: 09/22/2020 3:28 PM Performed by: Gilles Chiquito, MD Authorized by: Gilles Chiquito, MD     Interpretation: normal     ECG rate assessment: normal     Rhythm: sinus rhythm     Ectopy: none     Conduction: normal     ____________________________________________   INITIAL IMPRESSION / ASSESSMENT AND PLAN / ED COURSE     Patient presents with above-stated history exam after a syncopal episode where she hit her head against a concrete wall.  On arrival she is afebrile and hemodynamically stable.  He did sustain a very small superficial laceration over the right eye as made above.  This was cleaned and while I offered a single suture to help close the wound patient declined.  Tetanus is up-to-date.  With regard to syncopal episodes differential includes possible vasovagal, orthostatic, ACS, arrhythmia, PE, metabolic derangements, anemia and cardiomyopathy.   Overall I have a low suspicion  for PE given absence of any tachycardia, tachypnea or hypoxia and recurrence of these episodes happening over the last  several years associate with seeming presyncopal symptoms including warmth, nausea and dizziness.   CT  head shows no evidence of skull fracture or any other acute cranial process.  Chest x-ray shows no evidence of focal consolidation, effusion, edema, pneumothorax or other clear acute intrathoracic process.  CMP shows no significant electrolyte or metabolic derangements.  CBC shows no leukocytosis or acute anemia.  UA is not suggestive of urinary tract infection patient otherwise denies any clear historical or exam features to suggest recent acute infectious process.  Troponin is undetectable and given patient denies any chest pain this was relatively reassuring EKG and overall I have low suspicion for ACS  Overall unclear allergy for patient's syncopal episode although certainly possible she was mildly dehydrated orthostatic and vasovagal is also in the differential.  She is not orthostatic here.  She states she felt better after IV fluids.  Given stable vitals otherwise reassuring exam work-up I think she is safe for discharge with outpatient follow-up.  Her wound was irrigated with saline and she declined offer for suture repair.  Discharged stable condition.  Strict return precautions advised and discussed.      ____________________________________________   FINAL CLINICAL IMPRESSION(S) / ED DIAGNOSES  Final diagnoses:  Syncope and collapse    Medications  lactated ringers bolus 1,000 mL (0 mLs Intravenous Stopped 09/22/20 1400)     ED Discharge Orders     None        Note:  This document was prepared using Dragon voice recognition software and may include unintentional dictation errors.    Gilles ChiquitoSmith, Briar Witherspoon P, MD 09/22/20 [phone]

## 2020-09-22 NOTE — ED Triage Notes (Signed)
Pt to ED [facility] for syncopal episode, c/o dizziness. States has not ate today. Reports hitting head on wall. Small laceration noted beside right eye.  Pt alert and oriented. NAD noted

## 2020-09-22 NOTE — ED Notes (Signed)
Pt ambulatory to restroom, NAD noted.  

## 2020-09-22 NOTE — ED Notes (Signed)
Pt to XRAY

## 2021-05-18 ENCOUNTER — Ambulatory Visit: Payer: Self-pay | Admitting: *Deleted

## 2021-05-18 NOTE — Telephone Encounter (Signed)
Reason for Disposition ? [1] MILD difficulty breathing (e.g., minimal/no SOB at rest, SOB with walking, pulse <100) AND [2] NEW-onset or WORSE than normal ? ?Answer Assessment - Initial Assessment Questions ?1. RESPIRATORY STATUS: "Describe your breathing?" (e.g., wheezing, shortness of breath, unable to speak, severe coughing)  ?    It seems like I have really low iron.  My hands are cold, my chest is tight and I'm having panic attacks since last night.    I think my iron is low.   My sister has this and has these symptoms. ?I've lost so much weight and I'm going through menopause.   I stopped my Prozac.   It gave me Restless Legs Syndrome so bad.    I'm having symptoms from all this at once.    I think it's making me short of breath. ?The breathing problem is not all the time.   The chest tightness starts during panic attacks and my breathing speeds up.    I have an adrenal gland issue 7 yrs ago.    The tumor has not grown.   The endocrinologist won't see me without a new referral because it's been 7 yrs. ?It seems all my drs. Leave.     ?2. ONSET: "When did this breathing problem begin?"  ?    It occurs in little bursts last week and is getting worse from the panic attacks.   ?3. PATTERN "Does the difficult breathing come and go, or has it been constant since it started?"  ?    intermittent ?4. SEVERITY: "How bad is your breathing?" (e.g., mild, moderate, severe)  ?  - MILD: No SOB at rest, mild SOB with walking, speaks normally in sentences, can lie down, no retractions, pulse < 100.  ?  - MODERATE: SOB at rest, SOB with minimal exertion and prefers to sit, cannot lie down flat, speaks in phrases, mild retractions, audible wheezing, pulse 100-120.  ?  - SEVERE: Very SOB at rest, speaks in single words, struggling to breathe, sitting hunched forward, retractions, pulse > 120  ?    I vape.     Short of breath with panic attacks ?5. RECURRENT SYMPTOM: "Have you had difficulty breathing before?" If Yes, ask: "When  was the last time?" and "What happened that time?"  ?    *No Answer* ?6. CARDIAC HISTORY: "Do you have any history of heart disease?" (e.g., heart attack, angina, bypass surgery, angioplasty)  ?    No ?7. LUNG HISTORY: "Do you have any history of lung disease?"  (e.g., pulmonary embolus, asthma, emphysema) ?    I vape ?8. CAUSE: "What do you think is causing the breathing problem?"  ?    Panic attacks maybe low iron. ?9. OTHER SYMPTOMS: "Do you have any other symptoms? (e.g., dizziness, runny nose, cough, chest pain, fever) ?    *No Answer* ?10. O2 SATURATION MONITOR:  "Do you use an oxygen saturation monitor (pulse oximeter) at home?" If Yes, "What is your reading (oxygen level) today?" "What is your usual oxygen saturation reading?" (e.g., 95%) ?      *No Answer* ?11. PREGNANCY: "Is there any chance you are pregnant?" "When was your last menstrual period?" ?      *No Answer* ?12. TRAVEL: "Have you traveled out of the country in the last month?" (e.g., travel history, exposures) ?      *No Answer* ? ?Protocols used: Breathing Difficulty-A-AH ? ?

## 2021-05-18 NOTE — Telephone Encounter (Signed)
?  Chief Complaint: Having panic attacks, possible low iron, shortness of breath chest tightness with panic attacks. ?Symptoms: shortness of breath with panic attacks ?Frequency: intermittently ?Pertinent Negatives: Patient denies Heart or lung issues except sje vapes. ?Disposition: [] ED /[] Urgent Care (no appt availability in office) / [x] Appointment(In office/virtual)/ []  [facility]/ [] Home Care/ [] Refused Recommended Disposition /[] [facility]/ []  Follow-up with PCP ?Additional Notes: Instructed to go to the ED if symptoms become worse.     ?

## 2021-05-19 ENCOUNTER — Ambulatory Visit: Payer: Commercial Managed Care - PPO | Admitting: Internal Medicine

## 2021-05-19 ENCOUNTER — Other Ambulatory Visit: Payer: Self-pay

## 2021-05-19 ENCOUNTER — Encounter: Payer: Self-pay | Admitting: Internal Medicine

## 2021-05-19 VITALS — BP 135/76 | HR 92 | Temp 97.7°F | Wt 143.0 lb

## 2021-05-19 DIAGNOSIS — R0789 Other chest pain: Secondary | ICD-10-CM

## 2021-05-19 DIAGNOSIS — E278 Other specified disorders of adrenal gland: Secondary | ICD-10-CM

## 2021-05-19 DIAGNOSIS — K219 Gastro-esophageal reflux disease without esophagitis: Secondary | ICD-10-CM | POA: Insufficient documentation

## 2021-05-19 DIAGNOSIS — G2581 Restless legs syndrome: Secondary | ICD-10-CM

## 2021-05-19 DIAGNOSIS — F419 Anxiety disorder, unspecified: Secondary | ICD-10-CM

## 2021-05-19 DIAGNOSIS — R634 Abnormal weight loss: Secondary | ICD-10-CM

## 2021-05-19 DIAGNOSIS — E782 Mixed hyperlipidemia: Secondary | ICD-10-CM

## 2021-05-19 DIAGNOSIS — R110 Nausea: Secondary | ICD-10-CM

## 2021-05-19 DIAGNOSIS — F410 Panic disorder [episodic paroxysmal anxiety] without agoraphobia: Secondary | ICD-10-CM | POA: Diagnosis not present

## 2021-05-19 DIAGNOSIS — G4452 New daily persistent headache (NDPH): Secondary | ICD-10-CM

## 2021-05-19 DIAGNOSIS — R0602 Shortness of breath: Secondary | ICD-10-CM

## 2021-05-19 DIAGNOSIS — R42 Dizziness and giddiness: Secondary | ICD-10-CM

## 2021-05-19 DIAGNOSIS — R002 Palpitations: Secondary | ICD-10-CM

## 2021-05-19 DIAGNOSIS — F32A Depression, unspecified: Secondary | ICD-10-CM

## 2021-05-19 DIAGNOSIS — R5383 Other fatigue: Secondary | ICD-10-CM

## 2021-05-19 DIAGNOSIS — R030 Elevated blood-pressure reading, without diagnosis of hypertension: Secondary | ICD-10-CM

## 2021-05-19 MED ORDER — BUSPIRONE HCL 5 MG PO TABS: 5.0000 mg | ORAL_TABLET | Freq: Two times a day (BID) | ORAL | 0 refills | Status: DC

## 2021-05-19 NOTE — Assessment & Plan Note (Signed)
Resolved after stopping SSRI ?

## 2021-05-19 NOTE — Assessment & Plan Note (Signed)
Resolved with weight loss.   

## 2021-05-19 NOTE — Assessment & Plan Note (Signed)
We will obtain CT abdomen with contrast ?Cortisol level today ?

## 2021-05-19 NOTE — Assessment & Plan Note (Signed)
Deteriorated ?She declines referral for therapy at this time ?Rx for BuSpar 5 mg every 8 hours as needed ?Support offered ?We will continue to monitor ?

## 2021-05-19 NOTE — Progress Notes (Signed)
Subjective:    Patient ID: Tasha Ferguson, female    DOB: 1970/06/13, 51 y.o.   MRN: [id number]  HPI  Patient presents to clinic today for follow-up of chronic conditions.  She is establishing care with me today, transferring care from Malva Cogan, NP.  Anxiety, Depression and Panic Attacks: Deteriorated, but she is not taking Fluoxetine or Hydroxyzine due to restless leg syndrome. She reports her daughter moved to [place] with her grandchild and she has not seen them in 3 years. She feels like menopause is contributing. She is not currently seeing a therapist at that time.  She denies SI/HI.  HLD: Her last LDL was 207, triglycerides 261, 11/2019.  She is not taking Atorvastatin and Ezetimibe.  She is not consuming a low-fat diet.  RLS: Resolved after stopping Fluoxetine and Hydroxyzine. She does not follow with neurology.  History of Left Adrenal Mass: No longer monitoring this. CT abdomen from 2014 reviewed.  GERD: Resolved with weight loss, she is not currently taking any medications for this There is no upper GI on file.  Of note, her BP today is 144/81. She has never been treated for HTN in the past. She does contribute this to anxiety. ECG from 09/2020.  Pt also reports headaches. These are occurring daily. She describes the headaches as pressure and throbbing at the top of her head. She reports some associated lightheadedness, but dizziness, vision changes, sensitivity to light or sound. She reports associated chest tightness, palpitations, shortness of breath, and nausea. She is concerned that her iron may be low and would like to have this checked today.  Review of Systems     Past Medical History:  Diagnosis Date   Anxiety    Breast mass in female    R side calcifiction   Depression    Mass    adernal gland being followed by urologist    Current Outpatient Medications  Medication Sig Dispense Refill   acetaminophen (TYLENOL) 500 MG tablet Take 500 mg by mouth every 6  (six) hours as needed.     atorvastatin (LIPITOR) 10 MG tablet Take 1 tablet (10 mg total) by mouth daily. 90 tablet 3   clotrimazole-betamethasone (LOTRISONE) cream Apply 1 application topically 2 (two) times daily. 30 g 0   esomeprazole (NEXIUM) 20 MG capsule Take 20 mg by mouth daily.      ezetimibe (ZETIA) 10 MG tablet Take 1 tablet (10 mg total) by mouth daily. 90 tablet 1   fluconazole (DIFLUCAN) 150 MG tablet Take 1 tablet now and repeat dose in 72 hours if having continued symptoms. 2 tablet 0   FLUoxetine (PROZAC) 40 MG capsule TAKE 2 CAPSULES BY MOUTH DAILY 180 capsule 1   hydrOXYzine (ATARAX/VISTARIL) 10 MG tablet Take 1 tablet (10 mg total) by mouth 3 (three) times daily as needed for anxiety. 40 tablet 0   ipratropium (ATROVENT) 0.06 % nasal spray Place 2 sprays into both nostrils 4 (four) times daily. 15 mL 12   rOPINIRole (REQUIP) 2 MG tablet Take 0.5 tablets (1 mg total) by mouth at bedtime. 90 tablet 1   No current facility-administered medications for this visit.    Allergies  Allergen Reactions   Codeine Nausea Only    Nausea and vomiting    Family History  Problem Relation Age of Onset   Hypertension Mother    Depression Mother    Diabetes Father    Alcohol abuse Father    Anxiety disorder Father    Depression  Father    Alcohol abuse Sister    Alcohol abuse Brother    Drug abuse Brother     Social History   Socioeconomic History   Marital status: Married    Spouse name: Not on file   Number of children: Not on file   Years of education: Not on file   Highest education level: Not on file  Occupational History   Not on file  Tobacco Use   Smoking status: Former    Types: E-cigarettes   Smokeless tobacco: Never  Vaping Use   Vaping Use: Every day   Substances: Nicotine  Substance and Sexual Activity   Alcohol use: No    Alcohol/week: 0.0 standard drinks   Drug use: No   Sexual activity: Yes  Other Topics Concern   Not on file  Social History  Narrative   Not on file   Social Determinants of Health   Financial Resource Strain: Not on file  Food Insecurity: Not on file  Transportation Needs: Not on file  Physical Activity: Not on file  Stress: Not on file  Social Connections: Not on file  Intimate Partner Violence: Not on file     Constitutional: Pt reports headaches, fatigue, unexplained weight loss. Denies fever, malaise, fatigue, headache or abrupt weight changes.  HEENT: Denies eye pain, eye redness, ear pain, ringing in the ears, wax buildup, runny nose, nasal congestion, bloody nose, or sore throat. Respiratory: Pt reports shortness of breath. Denies difficulty breathing, cough or sputum production.   Cardiovascular: Pt reports chest pressure, chest tightness and palpitations.Denies chest pain,  or swelling in the hands or feet.  Gastrointestinal: Pt reports nausea. Denies abdominal pain, bloating, constipation, diarrhea or blood in the stool.  GU: Denies urgency, frequency, pain with urination, burning sensation, blood in urine, odor or discharge. Musculoskeletal: Denies decrease in range of motion, difficulty with gait, muscle pain or joint pain and swelling.  Skin: Denies redness, rashes, lesions or ulcercations.  Neurological: Patient reports intermittent lightheadedness. Denies difficulty with memory, difficulty with speech or problems with balance and coordination.  Psych: Patient reports anxiety, depression and panic attacks.  Denies SI/HI.  No other specific complaints in a complete review of systems (except as listed in HPI above).  Objective:   Physical Exam  BP 135/76    Pulse 92    Temp 97.7 F (36.5 C) (Temporal)    Wt 143 lb (64.9 kg)    LMP 04/07/2019    SpO2 98%    BMI 21.12 kg/m   Wt Readings from Last 3 Encounters:  09/22/20 180 lb (81.6 kg)  07/21/20 184 lb 15.5 oz (83.9 kg)  12/06/19 185 lb (83.9 kg)    General: Appears her stated age, well developed, well nourished in NAD. Skin: Warm, dry  and intact. HEENT: Head: normal shape and size; Eyes: sclera white, PERRLA and EOMs intact;  Neck:  Neck supple, trachea midline. No masses, lumps or thyromegaly present.  Cardiovascular: Normal rate and rhythm. S1,S2 noted.  No murmur, rubs or gallops noted.  Pulmonary/Chest: Normal effort and positive vesicular breath sounds. No respiratory distress. No wheezes, rales or ronchi noted.  Abdomen: Soft and nontender. Normal bowel sounds.  Musculoskeletal: No difficulty with gait.  Neurological: Alert and oriented. Coordination normal.  Psychiatric: Anxious appearing.  BMET    Component Value Date/Time   NA 138 09/22/2020 1116   K 3.7 09/22/2020 1116   CL 106 09/22/2020 1116   CO2 24 09/22/2020 1116   GLUCOSE 123 (  H) 09/22/2020 1116   BUN 12 09/22/2020 1116   CREATININE 0.88 09/22/2020 1116   CREATININE 0.93 12/06/2019 0938   CALCIUM 8.4 (L) 09/22/2020 1116   GFRNONAA >60 09/22/2020 1116   GFRNONAA 72 12/06/2019 0938   GFRAA 84 12/06/2019 0938    Lipid Panel     Component Value Date/Time   CHOL 296 (H) 12/06/2019 0938   TRIG 261 (H) 12/06/2019 0938   HDL 42 (L) 12/06/2019 0938   CHOLHDL 7.0 (H) 12/06/2019 0938   LDLCALC 207 (H) 12/06/2019 0938    CBC    Component Value Date/Time   WBC 10.3 09/22/2020 1116   RBC 4.14 09/22/2020 1116   HGB 13.4 09/22/2020 1116   HCT 38.5 09/22/2020 1116   PLT 275 09/22/2020 1116   MCV 93.0 09/22/2020 1116   MCH 32.4 09/22/2020 1116   MCHC 34.8 09/22/2020 1116   RDW 12.0 09/22/2020 1116   LYMPHSABS 2.2 09/22/2020 1116   MONOABS 0.9 09/22/2020 1116   EOSABS 0.2 09/22/2020 1116   BASOSABS 0.1 09/22/2020 1116         Assessment & Plan:   Headaches, Lightheadedness, Chest Tightness, Palpitations, Shortness of Breath, Nausea, Fatigue and Unexplained Weight Loss:  ? R/t anxiety and depression, vs left adrenal mass vs thyroid disorder vs anemia Will check CBC, IBC panel, TSH, cortisol  Elevated Blood Pressure Reading in Office  without Diagnosis of HTN:  Manual repeat normal We will monitor  We will follow-up after labs with further recommendation and treatment plan  Nicki Reaper, NP This visit occurred during the SARS-CoV-2 public health emergency.  Safety protocols were in place, including screening questions prior to the visit, additional usage of staff PPE, and extensive cleaning of exam room while observing appropriate contact time as indicated for disinfecting solutions.

## 2021-05-19 NOTE — Assessment & Plan Note (Signed)
Deteriorated ?She declines referral for therapy at this time ?Rx for BuSpar 5 mg every 8 hours as needed ?Support offered ?We will continue to monitor ?

## 2021-05-19 NOTE — Assessment & Plan Note (Signed)
C-Met and lipid profile today ?Encouraged her to consume a low-fat diet ?

## 2021-05-19 NOTE — Patient Instructions (Signed)

## 2021-05-20 ENCOUNTER — Telehealth: Payer: Self-pay

## 2021-05-20 LAB — COMPLETE METABOLIC PANEL WITH GFR
AG Ratio: 2 (calc) (ref 1.0–2.5)
ALT: 11 U/L (ref 6–29)
AST: 13 U/L (ref 10–35)
Albumin: 4.7 g/dL (ref 3.6–5.1)
Alkaline phosphatase (APISO): 62 U/L (ref 37–153)
BUN: 15 mg/dL (ref 7–25)
CO2: 25 mmol/L (ref 20–32)
Calcium: 9.9 mg/dL (ref 8.6–10.4)
Chloride: 104 mmol/L (ref 98–110)
Creat: 0.95 mg/dL (ref 0.50–1.03)
Globulin: 2.4 g/dL (calc) (ref 1.9–3.7)
Glucose, Bld: 104 mg/dL (ref 65–139)
Potassium: 4.1 mmol/L (ref 3.5–5.3)
Sodium: 138 mmol/L (ref 135–146)
Total Bilirubin: 0.7 mg/dL (ref 0.2–1.2)
Total Protein: 7.1 g/dL (ref 6.1–8.1)
eGFR: 73 mL/min/{1.73_m2} (ref >=60)

## 2021-05-20 LAB — CBC
HCT: 43.7 % (ref 35.0–45.0)
Hemoglobin: 14.9 g/dL (ref 11.7–15.5)
MCH: 31.6 pg (ref 27.0–33.0)
MCHC: 34.1 g/dL (ref 32.0–36.0)
MCV: 92.6 fL (ref 80.0–100.0)
MPV: 9.8 fL (ref 7.5–12.5)
Platelets: 271 10*3/uL (ref 140–400)
RBC: 4.72 10*6/uL (ref 3.80–5.10)
RDW: 11.8 % (ref 11.0–15.0)
WBC: 6 10*3/uL (ref 3.8–10.8)

## 2021-05-20 LAB — LIPID PANEL
Cholesterol: 226 mg/dL — ABNORMAL HIGH (ref <200)
HDL: 45 mg/dL — ABNORMAL LOW (ref >=50)
LDL Cholesterol (Calc): 150 mg/dL (calc) — ABNORMAL HIGH
Non-HDL Cholesterol (Calc): 181 mg/dL (calc) — ABNORMAL HIGH (ref <130)
Total CHOL/HDL Ratio: 5 (calc) — ABNORMAL HIGH (ref <5.0)
Triglycerides: 177 mg/dL — ABNORMAL HIGH (ref <150)

## 2021-05-20 LAB — IRON,TIBC AND FERRITIN PANEL
%SAT: 44 % (calc) (ref 16–45)
Ferritin: 55 ng/mL (ref 16–232)
Iron: 121 ug/dL (ref 45–160)
TIBC: 272 mcg/dL (calc) (ref 250–450)

## 2021-05-20 LAB — CORTISOL: Cortisol, Plasma: 30.6 ug/dL — ABNORMAL HIGH

## 2021-05-20 LAB — TSH: TSH: 1.16 mIU/L

## 2021-05-20 MED ORDER — ATORVASTATIN CALCIUM 10 MG PO TABS: 10.0000 mg | ORAL_TABLET | Freq: Every day | ORAL | 0 refills | Status: DC

## 2021-05-20 NOTE — Telephone Encounter (Signed)
Pt advised.  She agreed to start atorvastatin 10mg .  RX sent to her pharmacy.  CPE scheduled for 08/26/2021 at 11am ? ?Thanks,  ? ?-08/28/2021  ?

## 2021-05-20 NOTE — Telephone Encounter (Signed)
-----   Message from Tasha Munroe, NP sent at 05/20/2021 10:42 AM EST ----- ?Cholesterol is elevated.  I would recommend a low-dose cholesterol-lowering medication at this time.  If she is agreeable, please send in atorvastatin 10 mg daily, 90, 0 refills.  Reinforced low saturated fat diet.  Blood counts are normal.  Liver and kidney function is normal.  Thyroid is normal.  She has no iron deficiency.  I will follow-up with her after her CT abdomen has resulted.  I would like to see her back in 3 months for her annual exam. ?

## 2021-06-02 ENCOUNTER — Ambulatory Visit: Admission: RE | Admit: 2021-06-02 | Payer: Commercial Managed Care - PPO | Source: Ambulatory Visit

## 2021-06-16 ENCOUNTER — Other Ambulatory Visit: Payer: Self-pay | Admitting: Internal Medicine

## 2021-06-16 NOTE — Telephone Encounter (Signed)
Requested Prescriptions  ?Pending Prescriptions Disp Refills  ?? busPIRone (BUSPAR) 5 MG tablet [Pharmacy Med Name: BUSPIRONE HCL 5 MG TABLET] 180 tablet 1  ?  Sig: TAKE 1 TABLET BY MOUTH TWICE A DAY  ?  ? Psychiatry: Anxiolytics/Hypnotics - Non-controlled Passed - 06/16/2021  2:15 AM  ?  ?  Passed - Valid encounter within last 12 months  ?  Recent Outpatient Visits   ?      ? 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 [facility] [place], Coralie Keens, NP  ? 1 year ago Annual physical exam  ? [facility], Lupita Raider, FNP  ? 1 year ago Anxiety and depression  ? [place], FNP  ? 2 years ago Panic attack  ? [facility] Merrilyn Puma, Jerrel Ivory, NP  ? 3 years ago Anxiety and depression  ? [facility] Merrilyn Puma, Jerrel Ivory, NP  ?  ?  ?Future Appointments   ?        ? In 2 months Ba[place], Coralie Keens, NP [facility], [place]  ?  ? ?  ?  ?  ? ?

## 2021-07-13 ENCOUNTER — Encounter: Payer: Self-pay | Admitting: Internal Medicine

## 2021-07-13 ENCOUNTER — Ambulatory Visit: Payer: Self-pay | Admitting: *Deleted

## 2021-07-13 ENCOUNTER — Telehealth (INDEPENDENT_AMBULATORY_CARE_PROVIDER_SITE_OTHER): Payer: Commercial Managed Care - PPO | Admitting: Internal Medicine

## 2021-07-13 DIAGNOSIS — R682 Dry mouth, unspecified: Secondary | ICD-10-CM | POA: Diagnosis not present

## 2021-07-13 DIAGNOSIS — R110 Nausea: Secondary | ICD-10-CM

## 2021-07-13 DIAGNOSIS — R42 Dizziness and giddiness: Secondary | ICD-10-CM

## 2021-07-13 DIAGNOSIS — J029 Acute pharyngitis, unspecified: Secondary | ICD-10-CM

## 2021-07-13 DIAGNOSIS — F32A Depression, unspecified: Secondary | ICD-10-CM

## 2021-07-13 DIAGNOSIS — F419 Anxiety disorder, unspecified: Secondary | ICD-10-CM

## 2021-07-13 DIAGNOSIS — T887XXA Unspecified adverse effect of drug or medicament, initial encounter: Secondary | ICD-10-CM

## 2021-07-13 MED ORDER — MECLIZINE HCL 25 MG PO TABS: 25.0000 mg | ORAL_TABLET | Freq: Three times a day (TID) | ORAL | 0 refills | Status: DC | PRN

## 2021-07-13 NOTE — Progress Notes (Signed)
Virtual Visit via Video Note ? ?I connected with Tasha Ferguson on 07/13/21 at 11:20 AM EDT by a video enabled telemedicine application and verified that I am speaking with the correct person using two identifiers. ? ?Location: ?Patient: Home ?Provider: Office ? ?Persons participating in this video call: Webb Silversmith, NP and Danielle Dess. ?  ?I discussed the limitations of evaluation and management by telemedicine and the availability of in person appointments. The patient expressed understanding and agreed to proceed. ? ?History of Present Illness: ? ?Patient reports that she has felt nauseated, sore throat, dry mouth and off balance.  She noticed this about 1 month ago after starting Buspar. She was having some of these symptoms prior, but she reports they have been more consistent since starting the Buspar. She reports she does not have any history of allergies. She is scared to drive because she has been so lightheaded. She denies any changes in other medications, diet or activity level. She has not tried anything OTC for this. ? ? ?Past Medical History:  ?Diagnosis Date  ? Anxiety   ? Breast mass in female   ? R side calcifiction  ? Depression   ? Mass   ? adernal gland being followed by urologist  ? ? ?Current Outpatient Medications  ?Medication Sig Dispense Refill  ? acetaminophen (TYLENOL) 500 MG tablet Take 500 mg by mouth every 6 (six) hours as needed.    ? atorvastatin (LIPITOR) 10 MG tablet Take 1 tablet (10 mg total) by mouth daily. 90 tablet 0  ? busPIRone (BUSPAR) 5 MG tablet TAKE 1 TABLET BY MOUTH TWICE A DAY 60 tablet 1  ? ?No current facility-administered medications for this visit.  ? ? ?Allergies  ?Allergen Reactions  ? Codeine Nausea Only  ?  Nausea and vomiting  ? ? ?Family History  ?Problem Relation Age of Onset  ? Hypertension Mother   ? Depression Mother   ? Diabetes Father   ? Alcohol abuse Father   ? Anxiety disorder Father   ? Depression Father   ? Alcohol abuse Sister   ? Alcohol abuse  Brother   ? Drug abuse Brother   ? ? ?Social History  ? ?Socioeconomic History  ? Marital status: Married  ?  Spouse name: Not on file  ? Number of children: Not on file  ? Years of education: Not on file  ? Highest education level: Not on file  ?Occupational History  ? Not on file  ?Tobacco Use  ? Smoking status: Former  ?  Types: E-cigarettes  ? Smokeless tobacco: Never  ?Vaping Use  ? Vaping Use: Every day  ? Substances: Nicotine  ?Substance and Sexual Activity  ? Alcohol use: No  ?  Alcohol/week: 0.0 standard drinks  ? Drug use: No  ? Sexual activity: Yes  ?Other Topics Concern  ? Not on file  ?Social History Narrative  ? Not on file  ? ?Social Determinants of Health  ? ?Financial Resource Strain: Not on file  ?Food Insecurity: Not on file  ?Transportation Needs: Not on file  ?Physical Activity: Not on file  ?Stress: Not on file  ?Social Connections: Not on file  ?Intimate Partner Violence: Not on file  ? ? ? ?Constitutional: Denies fever, malaise, fatigue, headache or abrupt weight changes.  ?HEENT: Pt reports dry mouth, sore throat. Denies eye pain, eye redness, ear pain, ringing in the ears, wax buildup, runny nose, nasal congestion, bloody nose ?Respiratory: Denies difficulty breathing, shortness of breath, cough  or sputum production.   ?Cardiovascular: Denies chest pain, chest tightness, palpitations or swelling in the hands or feet.  ?Gastrointestinal: Pt reports nausea. Denies abdominal pain, bloating, constipation, diarrhea or blood in the stool.  ?GU: Denies urgency, frequency, pain with urination, burning sensation, blood in urine, odor or discharge. ?Musculoskeletal: Denies decrease in range of motion, difficulty with gait, muscle pain or joint pain and swelling.  ?Skin: Denies redness, rashes, lesions or ulcercations.  ?Neurological: Pt reports lightheadedness. Denies dizziness, difficulty with memory, difficulty with speech or problems with balance and coordination.  ?Psych: Pt has a history of  anxiety and depression. Denies SI/HI. ? ?No other specific complaints in a complete review of systems (except as listed in HPI above). ? ?  ?Observations/Objective: ?LMP 04/07/2019  ?Wt Readings from Last 3 Encounters:  ?05/19/21 143 lb (64.9 kg)  ?09/22/20 180 lb (81.6 kg)  ?07/21/20 184 lb 15.5 oz (83.9 kg)  ? ? ?General: Appears her stated age, well developed, well nourished in NAD. ?Pulmonary/Chest: Normal effort. No respiratory distress.  ?Neurological: Alert and oriented. Coordination normal.  ?Psychiatric: Mood and affect normal. Behavior is normal. Judgment and thought content normal.  ? ? ?BMET ?   ?Component Value Date/Time  ? NA 138 05/19/2021 0906  ? K 4.1 05/19/2021 0906  ? CL 104 05/19/2021 0906  ? CO2 25 05/19/2021 0906  ? GLUCOSE 104 05/19/2021 0906  ? BUN 15 05/19/2021 0906  ? CREATININE 0.95 05/19/2021 0906  ? CALCIUM 9.9 05/19/2021 0906  ? GFRNONAA >60 09/22/2020 1116  ? GFRNONAA 72 12/06/2019 0938  ? GFRAA 84 12/06/2019 0938  ? ? ?Lipid Panel  ?   ?Component Value Date/Time  ? CHOL 226 (H) 05/19/2021 0906  ? TRIG 177 (H) 05/19/2021 0906  ? HDL 45 (L) 05/19/2021 0906  ? CHOLHDL 5.0 (H) 05/19/2021 [id number]  ? [place] 150 (H) 05/19/2021 0906  ? ? ?CBC ?   ?Component Value Date/Time  ? WBC 6.0 05/19/2021 0906  ? RBC 4.72 05/19/2021 0906  ? HGB 14.9 05/19/2021 0906  ? HCT 43.7 05/19/2021 0906  ? PLT 271 05/19/2021 0906  ? MCV 92.6 05/19/2021 0906  ? MCH 31.6 05/19/2021 0906  ? MCHC 34.1 05/19/2021 0906  ? RDW 11.8 05/19/2021 0906  ? LYMPHSABS 2.2 09/22/2020 1116  ? MONOABS 0.9 09/22/2020 1116  ? EOSABS 0.2 09/22/2020 1116  ? BASOSABS 0.1 09/22/2020 1116  ? ? ?Hgb A1C ?No results found for: HGBA1C ? ? ? ? ?Assessment and Plan: ? ?Medication Side Effects: Lightheadedness, Dry Mouth, Sore Throat and Nausea: ? ?Discussed that there could be other contributing factors such as allergy/sinus that could be contributing to these symptoms ?Advised her to stop the Buspar ?RX for Mecllizine 25 mg TID prn for nausea  and lightheadedness ?Advised her once she is symptom free, we could discuss starting her on something different for her mood ? ?She is due in 1 month for follow up  ?Follow Up Instructions: ? ?  ?I discussed the assessment and treatment plan with the patient. The patient was provided an opportunity to ask questions and all were answered. The patient agreed with the plan and demonstrated an understanding of the instructions. ?  ?The patient was advised to call back or seek an in-person evaluation if the symptoms worsen or if the condition fails to improve as anticipated. ? ? ?Webb Silversmith, NP ? ?

## 2021-07-13 NOTE — Telephone Encounter (Signed)
?  Chief Complaint: medication SE ?Symptoms: nausea, dizzy-off balance ?Frequency: within one week of changing medication ?Pertinent Negatives: Patient denies   ?Disposition: [] ED /[] Urgent Care (no appt availability in office) / [x] Appointment(In office/virtual)/ []  [place] [facility]/ [] Home Care/ [] Refused Recommended Disposition /[] [place] [facility]/ []  Follow-up with PCP ?Additional Notes:    ?

## 2021-07-13 NOTE — Patient Instructions (Signed)
Basics of Medicine Management Taking your medicines correctly is an important part of managing or preventing medical problems. Make sure you know what disease or condition your medicine is treating, and how and when to take it. If you do not take your medicine correctly, it may not work well and may cause unpleasant side effects, including serious health problems. What should I do when I am taking medicines?  Read all the labels and inserts that come with your medicines. Review the information often and with each refill. Talk with your pharmacist if you get a refill and notice a change in the size, color, or shape of your medicines. Know the potential side effects for each medicine that you take. Try to get all your medicines from the same pharmacy. The pharmacist will have all your information and will understand how your medicines will affect each other (interact). Always carry an updated list of your medicines with you. If there is an emergency, a first responder can quickly see what medicines you are taking. Tell your health care provider about all your medicines, including over-the-counter medicines, vitamins, and herbal or dietary supplements. Your health care provider will make sure that nothing will interact with any of your prescribed medicines. How can I take my medicines safely? Take medicines only as told by your health care provider. Do not take more of your medicine than instructed. Do not take anyone else's medicines. Do not share your medicines with others. Do not stop taking your medicines unless your health care provider tells you to do so. You may need to avoid alcohol or certain foods or liquids when taking certain medicines. Follow your health care provider's instructions. Do not split, cut, crush, or chew your medicines unless your health care provider tells you to do so. Tell your health care provider if you have trouble swallowing your medicines. For liquid medicine, use the  dosing container that was provided. Household spoons are not accurate. How should I organize my medicines?  Know your medicines Know what each of your medicines looks like. This includes size, color, and shape. Tell your health care provider if you are having trouble recognizing all the medicines that you are taking. If you cannot tell your medicines apart because they look similar, keep them in the original bottles. If you cannot read the labels on the bottles, tell your pharmacist to put your medicines in containers with large print. Review your medicines and your schedule with family members, a friend, or a caregiver. Use a pill organizer Use a tool to organize your medicine schedule. Tools include a weekly pillbox, a written chart, a notebook, or a calendar. Your tool should help you remember the following things about each medicine: The name of the medicine. The amount (dose) to take. The schedule. This is the day and time the medicine should be taken. The appearance. This includes color, shape, size, and stamp. How to take your medicines. This includes instructions to take them with food, without food, with fluids, or with other medicines. Create reminders for taking your medicines. Use sticky notes, or use alarms on your watch, mobile device, or phone calendar. You may choose to use a more advanced management system. These systems have storage, alarms, and visual and audio prompts. Some medicines can be taken on an "as-needed" basis. These may include medicines for nausea, constipation, pain, cough and cold, allergies, and anxiety. If you take an as-needed medicine, write down the name and dose, as well as the date and time   that you took it. How should I plan for travel? Take your pillbox, medicines, and organization system with you when traveling. Have your medicines refilled before you travel. This will ensure that you do not run out of your medicines while you are away from  home. Always carry an updated list of your medicines with you. If there is an emergency, a first responder can quickly see what medicines you are taking. Do not pack your medicines in checked luggage in case your luggage is lost or delayed. Keep your medicines in your carry-on bag. If any of your medicines is considered a controlled substance, make sure you bring a letter from your health care provider with you. How should I store and discard my medicines? For safe storage: Store medicines in a cool, dry area away from light, or as directed by your health care provider. Do not store medicines in the bathroom. Heat and humidity will affect them. Do not store your medicines with other chemicals or with medicines for pets or other household members. Keep medicines away from children and pets. Do not leave them on counters or bedside tables. Store them in high cabinets or on high shelves. For safe disposal: Check expiration dates regularly. Do not take expired medicines. Discard medicines that are older than the expiration date. Learn a safe way to dispose of your medicines. You may: Use a local government, hospital, or pharmacy medicine-take-back program. If you cannot return the medicine, check the label or package insert to see if the medicine should be thrown out in the garbage or flushed down the toilet. If you are not sure, ask your health care team. If it is safe to put the medicine in the trash, empty the medicine out of the container. Mix the medicine with cat litter, dirt, coffee grounds, or another unwanted substance. Seal the mixture in a bag or container. Put it in the trash. What should I remember? Tell your health care provider if you: Experience side effects. Have new symptoms. Feel that your medicine is no longer working. Have other concerns about taking your medicines. Review your medicines regularly with your health care provider. Other medicines, diet, medical conditions, weight  changes, and daily habits can all affect how medicines work. Ask if you need to continue taking each medicine, and discuss how well each one is working. Refill your medicines early to avoid running out of them. In case of an accidental overdose, call your local poison control center at 1-800-222-1222 or go to your local emergency department right away. Summary Taking your medicines correctly is an important part of managing or preventing medical problems. You need to make sure that you understand what you are taking a medicine for, as well as how and when you need to take it. Use a tool to organize your medicine schedule. Tools include a weekly pillbox, a written chart, a notebook, or a calendar. In case of an accidental overdose, call your local Poison Control Center at 1-800-222-1222 or go to your local emergency department right away. This information is not intended to replace advice given to you by your health care provider. Make sure you discuss any questions you have with your health care provider. Document Revised: 10/07/2020 Document Reviewed: 10/07/2020 Elsevier Patient Education  2023 Elsevier Inc.  

## 2021-07-13 NOTE — Telephone Encounter (Signed)
Summary: Nausea and being off balance.  ? Pt is requesting a call back in regard to the medication busPIRone (BUSPAR) 5 MG tablet she is currently on, and side effects of the medication.  For about a week after starting medication pt stated she has been off balance and nauseated.  ? ? ?Pt seeking clinical advice.   ?  ? ?Reason for Disposition ? [1] Caller has URGENT medicine question about med that PCP or specialist prescribed AND [2] triager unable to answer question ? ?Answer Assessment - Initial Assessment Questions ?1. NAME of MEDICATION: "What medicine are you calling about?" ?    Buspar- started taking at last appointment ?2. QUESTION: "What is your question?" (e.g., double dose of medicine, side effect) ?    Nausea- within 1 week- nausea, motion sickness/off balance ?3. PRESCRIBING HCP: "Who prescribed it?" Reason: if prescribed by specialist, call should be referred to that group. ?    PCP ?4. SYMPTOMS: "Do you have any symptoms?" ?    Nausea, off balance ?Still taking medication- last dose this morning. ? ?Protocols used: Medication Question Call-A-AH ? ?

## 2021-08-16 ENCOUNTER — Other Ambulatory Visit: Payer: Self-pay | Admitting: Internal Medicine

## 2021-08-17 NOTE — Telephone Encounter (Signed)
Requested medications are due for refill today. unsure  Requested medications are on the active medications list.  yes  Last refill. 05/20/2021 #90 0 refills  Future visit scheduled.   Yes - June 14th for CPE  Notes to clinic.  New medication to pt from last OV, unsure if PCP wants pt to stay on this medication.    Requested Prescriptions  Pending Prescriptions Disp Refills   atorvastatin (LIPITOR) 10 MG tablet [Pharmacy Med Name: ATORVASTATIN 10 MG TABLET] 90 tablet 0    Sig: TAKE 1 TABLET BY MOUTH EVERY DAY     Cardiovascular:  Antilipid - Statins Failed - 08/16/2021  1:35 AM      Failed - Lipid Panel in normal range within the last 12 months    Cholesterol  Date Value Ref Range Status  05/19/2021 226 (H) <200 mg/dL Final   LDL Cholesterol (Calc)  Date Value Ref Range Status  05/19/2021 150 (H) mg/dL (calc) Final    Comment:    Reference range: <100 . Desirable range <100 mg/dL for primary prevention;   <70 mg/dL for patients with CHD or diabetic patients  with > or = 2 CHD risk factors. Marland Kitchen LDL-C is now calculated using the Martin-Hopkins  calculation, which is a validated novel method providing  better accuracy than the Friedewald equation in the  estimation of LDL-C.  Cresenciano Genre et al. Annamaria Helling. [id number]): 2061-2068  (http://education.[employer].com/faq/FAQ164)    HDL  Date Value Ref Range Status  05/19/2021 45 (L) > OR = 50 mg/dL Final   Triglycerides  Date Value Ref Range Status  05/19/2021 177 (H) <150 mg/dL Final         Passed - Patient is not pregnant      Passed - Valid encounter within last 12 months    Recent Outpatient Visits           1 month ago [facility] [place], Coralie Keens, NP   3 months ago Left adrenal mass [facility])   [facility], Coralie Keens, NP   1 year ago Annual physical exam   [facility], Lupita Raider, FNP   1 year ago Anxiety and depression   [facility], Lupita Raider, FNP   3 years ago Panic attack   [facility] Merrilyn Puma, Jerrel Ivory, NP       Future Appointments             In 1 week Garnette Gunner, Coralie Keens, NP [facility]

## 2021-08-26 ENCOUNTER — Encounter: Payer: Commercial Managed Care - PPO | Admitting: Internal Medicine

## 2021-11-13 ENCOUNTER — Other Ambulatory Visit: Payer: Self-pay | Admitting: Internal Medicine

## 2021-11-17 NOTE — Telephone Encounter (Signed)
Requested Prescriptions  Pending Prescriptions Disp Refills  . atorvastatin (LIPITOR) 10 MG tablet [Pharmacy Med Name: ATORVASTATIN 10 MG TABLET] 90 tablet 0    Sig: TAKE 1 TABLET BY MOUTH EVERY DAY     Cardiovascular:  Antilipid - Statins Failed - 11/13/2021  1:31 PM      Failed - Lipid Panel in normal range within the last 12 months    Cholesterol  Date Value Ref Range Status  05/19/2021 226 (H) <200 mg/dL Final   LDL Cholesterol (Calc)  Date Value Ref Range Status  05/19/2021 150 (H) mg/dL (calc) Final    Comment:    Reference range: <100 . Desirable range <100 mg/dL for primary prevention;   <70 mg/dL for patients with CHD or diabetic patients  with > or = 2 CHD risk factors. Marland Kitchen LDL-C is now calculated using the Martin-Hopkins  calculation, which is a validated novel method providing  better accuracy than the Friedewald equation in the  estimation of LDL-C.  Horald Pollen et al. Lenox Ahr. 7026;378(58): 2061-2068  (http://education.[facility].com/faq/FAQ164)    HDL  Date Value Ref Range Status  05/19/2021 45 (L) > OR = 50 mg/dL Final   Triglycerides  Date Value Ref Range Status  05/19/2021 177 (H) <150 mg/dL Final         Passed - Patient is not pregnant      Passed - Valid encounter within last 12 months    Recent Outpatient Visits          4 months ago Lightheadedness   [facility] [place], Salvadore Oxford, NP   6 months ago Left adrenal mass [facility])   [facility], Salvadore Oxford, NP   1 year ago Annual physical exam   [facility], Jodelle Gross, FNP   2 years ago Anxiety and depression   [facility], Jodelle Gross, FNP   3 years ago Panic attack   [facility] [place], Alison Stalling, NP

## 2021-11-26 ENCOUNTER — Ambulatory Visit: Payer: Self-pay | Admitting: *Deleted

## 2021-11-26 NOTE — Telephone Encounter (Signed)
Chief Complaint: panic attacks and anxiety  Symptoms: received Jury summons from [facility]. Reports she has not been able to leave her house since last November after stopping prozac. Started on buspar 5 mg and stopped taking 2 weeks after starting medication in 06/16/21 due to severe nausea. Patient has not been on any medication for anxiety since April and has been ordering groceries and all needs to be delivered to home. C/o chest pressure and difficulty breathing and heart racing. Recommended ED and patient reports chest pressure "not that bad". Not sleeping  Frequency: since April 2023 Pertinent Negatives: Patient denies chest pain, no fever, no suicidal thoughts.  Disposition: [] ED /[] Urgent Care (no appt availability in office) / [x] Appointment(In office/virtual)/ []  [facility]/ [] Home Care/ [] Refused Recommended Disposition /[] [facility]/ []  Follow-up with PCP Additional Notes:   Earliest My Chart VV scheduled for  12/02/21. Patient given # to call to reactivate My Chart account. Please advise if earlier  appt can be scheduled via VV. Patient requesting email to be sent for jury summons. Reports she can not go out of the house at this time. Please advise . No taking any medication for anxiety .     Reason for Disposition  MODERATE anxiety (e.g., persistent or frequent anxiety symptoms; interferes with sleep, school, or work)  Answer Assessment - Initial Assessment Questions 1. CONCERN: "Did anything happen that prompted you to call today?"      Panic attacks can't leave house. Got jury summons to go to [place].  2. ANXIETY SYMPTOMS: "Can you describe how you (your loved one; patient) have been feeling?" (e.g., tense, restless, panicky, anxious, keyed up, overwhelmed, sense of impending doom).      Anxious , overwhelmed, panic attacks  3. ONSET: "How long have you been feeling this way?" (e.g., hours, days, weeks)     Since November 2022 4. SEVERITY: "How  would you rate the level of anxiety?" (e.g., 0 - 10; or mild, moderate, severe).     severe 5. FUNCTIONAL IMPAIRMENT: "How have these feelings affected your ability to do daily activities?" "Have you had more difficulty than usual doing your normal daily activities?" (e.g., getting better, same, worse; self-care, school, work, interactions)     Yes does not want to leave house  6. HISTORY: "Have you felt this way before?" "Have you ever been diagnosed with an anxiety problem in the past?" (e.g., generalized anxiety disorder, panic attacks, PTSD). If Yes, ask: "How was this problem treated?" (e.g., medicines, counseling, etc.)     Yes  7. RISK OF HARM - SUICIDAL IDEATION: "Do you ever have thoughts of hurting or killing yourself?" If Yes, ask:  "Do you have these feelings now?" "Do you have a plan on how you would do this?"     Denies  8. TREATMENT:  "What has been done so far to treat this anxiety?" (e.g., medicines, relaxation strategies). "What has helped?"     Has tried medicines and stopped buspar 2 weeks after trying medication due to nausea  9. TREATMENT - THERAPIST: "Do you have a counselor or therapist? Name?"     na 10. POTENTIAL TRIGGERS: "Do you drink caffeinated beverages (e.g., coffee, colas, teas), and how much daily?" "Do you drink alcohol or use any drugs?" "Have you started any new medicines recently?"       Na  11. PATIENT SUPPORT: "Who is with you now?" "Who do you live with?" "Do you have family or friends who you can talk  to?"        Husband is at home  12. OTHER SYMPTOMS: "Do you have any other symptoms?" (e.g., feeling depressed, trouble concentrating, trouble sleeping, trouble breathing, palpitations or fast heartbeat, chest pain, sweating, nausea, or diarrhea)       Trouble sleeping, feel panic attacks, chest pressure, difficulty breathing  13. PREGNANCY: "Is there any chance you are pregnant?" "When was your last menstrual period?"       na  Protocols used: Anxiety and  Panic Attack-A-AH

## 2021-11-27 ENCOUNTER — Ambulatory Visit: Payer: Commercial Managed Care - PPO | Admitting: Internal Medicine

## 2021-11-27 ENCOUNTER — Encounter: Payer: Self-pay | Admitting: Internal Medicine

## 2021-11-27 VITALS — BP 138/80 | HR 102 | Temp 97.1°F | Ht 69.0 in | Wt 141.0 lb

## 2021-11-27 DIAGNOSIS — F4000 Agoraphobia, unspecified: Secondary | ICD-10-CM | POA: Diagnosis not present

## 2021-11-27 DIAGNOSIS — Z0289 Encounter for other administrative examinations: Secondary | ICD-10-CM | POA: Diagnosis not present

## 2021-11-27 DIAGNOSIS — F32A Depression, unspecified: Secondary | ICD-10-CM | POA: Diagnosis not present

## 2021-11-27 DIAGNOSIS — Z0001 Encounter for general adult medical examination with abnormal findings: Secondary | ICD-10-CM

## 2021-11-27 DIAGNOSIS — F419 Anxiety disorder, unspecified: Secondary | ICD-10-CM | POA: Diagnosis not present

## 2021-11-27 MED ORDER — PAROXETINE HCL ER 12.5 MG PO TB24: 12.5000 mg | ORAL_TABLET | Freq: Every day | ORAL | 0 refills | Status: DC

## 2021-11-27 NOTE — Patient Instructions (Signed)

## 2021-11-27 NOTE — Telephone Encounter (Signed)
I will see her today at 11:40

## 2021-11-27 NOTE — Progress Notes (Signed)
Subjective:    Patient ID: Tasha Ferguson, female    DOB: 09-13-1970, 51 y.o.   MRN: [id number]  HPI  Patient presents to clinic today for her annual exam.  She has needs a letter to excuse her from jury duty due to her anxiety.  Flu: 12/2017 Tetanus: unsure COVID: Pfizer x2 Shingrix: never Pap smear: 11/2019 Mammogram: >2 years ago Colon screening: never Vision screening: as needed Dentist: as needed  Diet: She does eat lean meat. She consumes veggies more than fruits. She tries to avoid fried foods. She drinks mostly water. 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     atorvastatin (LIPITOR) 10 MG tablet TAKE 1 TABLET BY MOUTH EVERY DAY 90 tablet 0   busPIRone (BUSPAR) 5 MG tablet TAKE 1 TABLET BY MOUTH TWICE A DAY 60 tablet 1   meclizine (ANTIVERT) 25 MG tablet Take 1 tablet (25 mg total) by mouth 3 (three) times daily as needed for dizziness. 30 tablet 0   No current facility-administered medications for this visit.    Allergies  Allergen Reactions   Codeine Nausea Only    Nausea and vomiting    Family History  Problem Relation Age of Onset   Hypertension Mother    Depression Mother    Diabetes Father    Alcohol abuse Father    Anxiety disorder Father    Depression Father    Alcohol abuse Sister    Alcohol abuse Brother    Drug abuse Brother     Social History   Socioeconomic History   Marital status: Married    Spouse name: Not on file   Number of children: Not on file   Years of education: Not on file   Highest education level: Not on file  Occupational History   Not on file  Tobacco Use   Smoking status: Former    Types:  E-cigarettes   Smokeless tobacco: Never  Vaping Use   Vaping Use: Every day   Substances: Nicotine  Substance and Sexual Activity   Alcohol use: No    Alcohol/week: 0.0 standard drinks of alcohol   Drug use: No   Sexual activity: Yes  Other Topics Concern   Not on file  Social History Narrative   Not on file   Social Determinants of Health   Financial Resource Strain: Not on file  Food Insecurity: Not on file  Transportation Needs: Not on file  Physical Activity: Not on file  Stress: Not on file  Social Connections: Not on file  Intimate Partner Violence: Not on file     Constitutional: Denies fever, malaise, fatigue, headache or abrupt weight changes.  HEENT: Denies eye pain, eye redness, ear pain, ringing in the ears, wax buildup, runny nose, nasal congestion, bloody nose, or sore throat. Respiratory: Denies difficulty breathing, shortness of breath, cough or sputum production.   Cardiovascular: Denies chest pain, chest tightness, palpitations or swelling in the hands or feet.  Gastrointestinal: Denies abdominal pain, bloating, constipation, diarrhea or blood in the stool.  GU: Denies urgency, frequency, pain with urination, burning sensation, blood  in urine, odor or discharge. Musculoskeletal: Denies decrease in range of motion, difficulty with gait, muscle pain or joint pain and swelling.  Skin: Denies redness, rashes, lesions or ulcercations.  Neurological: Patient reports restless legs.  Denies dizziness, difficulty with memory, difficulty with speech or problems with balance and coordination.  Psych: Patient has a history of anxiety and depression.  Denies anxiety, depression,   No other specific complaints in a complete review of systems (except as listed in HPI above).  Objective:   Physical Exam BP 138/80 (BP Location: Right Arm, Patient Position: Sitting, Cuff Size: Normal)   Pulse (!) 102   Temp (!) 97.1 F (36.2 C) (Temporal)   Ht 5\' 9"  (1.753 m)   Wt 141  lb (64 kg)   LMP 04/07/2019   SpO2 100%   BMI 20.82 kg/m   Wt Readings from Last 3 Encounters:  05/19/21 143 lb (64.9 kg)  09/22/20 180 lb (81.6 kg)  07/21/20 184 lb 15.5 oz (83.9 kg)    General: Appears her stated age, well developed, well nourished in NAD. Skin: Warm, dry and intact.  HEENT: Head: normal shape and size; Eyes: sclera white, no icterus, conjunctiva pink, PERRLA and EOMs intact;  Neck:  Neck supple, trachea midline. No masses, lumps or thyromegaly present.  Cardiovascular: Tachycardic with normal rhythm. S1,S2 noted.  No murmur, rubs or gallops noted. No JVD or BLE edema. No carotid bruits noted. Pulmonary/Chest: Normal effort and positive vesicular breath sounds. No respiratory distress. No wheezes, rales or ronchi noted.  Abdomen: Normal bowel sounds.  Musculoskeletal: Strength 5/5 BUE/BLE. No difficulty with gait.  Neurological: Alert and oriented. Cranial nerves II-XII grossly intact. Coordination normal.  Psychiatric: Mood and affect normal.  Anxious appearing. Judgment and thought content normal.    BMET    Component Value Date/Time   NA 138 05/19/2021 0906   K 4.1 05/19/2021 0906   CL 104 05/19/2021 0906   CO2 25 05/19/2021 0906   GLUCOSE 104 05/19/2021 0906   BUN 15 05/19/2021 0906   CREATININE 0.95 05/19/2021 0906   CALCIUM 9.9 05/19/2021 0906   GFRNONAA >60 09/22/2020 1116   GFRNONAA 72 12/06/2019 0938   GFRAA 84 12/06/2019 0938    Lipid Panel     Component Value Date/Time   CHOL 226 (H) 05/19/2021 0906   TRIG 177 (H) 05/19/2021 0906   HDL 45 (L) 05/19/2021 0906   CHOLHDL 5.0 (H) 05/19/2021 0906   LDLCALC 150 (H) 05/19/2021 0906    CBC    Component Value Date/Time   WBC 6.0 05/19/2021 0906   RBC 4.72 05/19/2021 0906   HGB 14.9 05/19/2021 0906   HCT 43.7 05/19/2021 0906   PLT 271 05/19/2021 0906   MCV 92.6 05/19/2021 0906   MCH 31.6 05/19/2021 0906   MCHC 34.1 05/19/2021 0906   RDW 11.8 05/19/2021 0906   LYMPHSABS 2.2 09/22/2020  1116   MONOABS 0.9 09/22/2020 1116   EOSABS 0.2 09/22/2020 1116   BASOSABS 0.1 09/22/2020 1116    Hgb A1C No results found for: "HGBA1C"          Assessment & Plan:   Preventative Health Maintenance:  She declines Flu shot today She declines Tetanus today Encourage her to get her COVID booster Discussed Shingrix vaccine, she will check coverage with her insurance company and schedule a nurse visit if she would like to have this done Pap smear UTD She declines mammogram at this time She declines referral for colon  screening of cologuard  this time Encouraged her to consume a balanced diet and exercise regimen Advised her to see an eye doctor and dentist annually She is refusing labs today  RTC in 6 months, follow-up chronic conditions Nicki Reaper, NP

## 2021-11-27 NOTE — Assessment & Plan Note (Signed)
Deteriorated We will trial paroxetine 12.5 mg ER daily Encourage her to schedule appointment with a therapist

## 2021-11-27 NOTE — Telephone Encounter (Signed)
Pt advised.   Thanks,   -Luba Matzen  

## 2021-11-30 ENCOUNTER — Encounter: Payer: Self-pay | Admitting: Internal Medicine

## 2021-12-02 ENCOUNTER — Telehealth: Payer: Commercial Managed Care - PPO | Admitting: Internal Medicine

## 2022-02-08 ENCOUNTER — Other Ambulatory Visit: Payer: Self-pay | Admitting: Internal Medicine

## 2022-02-09 NOTE — Telephone Encounter (Signed)
Requested Prescriptions  Pending Prescriptions Disp Refills   PARoxetine (PAXIL-CR) 12.5 MG 24 hr tablet [Pharmacy Med Name: PAROXETINE ER 12.5 MG TABLET] 90 tablet 3    Sig: TAKE 1 TABLET BY MOUTH EVERY DAY     Psychiatry:  Antidepressants - SSRI Passed - 02/08/2022  8:28 AM      Passed - Completed PHQ-2 or PHQ-9 in the last 360 days      Passed - Valid encounter within last 6 months    Recent Outpatient Visits           2 months ago Encounter for general adult medical examination with abnormal findings   [facility], Salvadore Oxford, NP   7 months ago Lightheadedness   [facility] [place], Salvadore Oxford, NP   8 months ago Left adrenal mass [facility])   [facility], Salvadore Oxford, NP   2 years ago Annual physical exam   [facility], Jodelle Gross, FNP   2 years ago Anxiety and depression   [facility], Jodelle Gross, FNP       Future Appointments             In 3 months Baity, Salvadore Oxford, NP [facility], [facility]             atorvastatin (LIPITOR) 10 MG tablet [Pharmacy Med Name: ATORVASTATIN 10 MG TABLET] 90 tablet 0    Sig: TAKE 1 TABLET BY MOUTH EVERY DAY     Cardiovascular:  Antilipid - Statins Failed - 02/08/2022  8:28 AM      Failed - Lipid Panel in normal range within the last 12 months    Cholesterol  Date Value Ref Range Status  05/19/2021 226 (H) <200 mg/dL Final   LDL Cholesterol (Calc)  Date Value Ref Range Status  05/19/2021 150 (H) mg/dL (calc) Final    Comment:    Reference range: <100 . Desirable range <100 mg/dL for primary prevention;   <70 mg/dL for patients with CHD or diabetic patients  with > or = 2 CHD risk factors. Marland Kitchen LDL-C is now calculated using the Martin-Hopkins  calculation, which is a validated novel method providing  better accuracy than the Friedewald equation in the  estimation of LDL-C.  Horald Pollen et al. Lenox Ahr. 1443;154(00): 2061-2068   (http://education.[employer].com/faq/FAQ164)    HDL  Date Value Ref Range Status  05/19/2021 45 (L) > OR = 50 mg/dL Final   Triglycerides  Date Value Ref Range Status  05/19/2021 177 (H) <150 mg/dL Final         Passed - Patient is not pregnant      Passed - Valid encounter within last 12 months    Recent Outpatient Visits           2 months ago Encounter for general adult medical examination with abnormal findings   [facility] [place], Salvadore Oxford, NP   7 months ago Lightheadedness   [facility], Salvadore Oxford, NP   8 months ago Left adrenal mass [facility])   [facility], Salvadore Oxford, NP   2 years ago Annual physical exam   [facility], Jodelle Gross, FNP   2 years ago Anxiety and depression   [facility], Jodelle Gross, FNP       Future Appointments             In  3 months Baity, Salvadore Oxford, NP [facility], [facility]

## 2022-05-28 ENCOUNTER — Ambulatory Visit: Payer: Commercial Managed Care - PPO | Admitting: Internal Medicine

## 2022-05-31 ENCOUNTER — Ambulatory Visit: Payer: Commercial Managed Care - PPO | Admitting: Internal Medicine

## 2022-06-22 ENCOUNTER — Ambulatory Visit: Payer: Commercial Managed Care - PPO | Admitting: Internal Medicine

## 2022-06-22 ENCOUNTER — Encounter: Payer: Self-pay | Admitting: Internal Medicine

## 2022-06-22 VITALS — BP 134/76 | HR 97 | Temp 96.6°F | Wt 158.0 lb

## 2022-06-22 DIAGNOSIS — R519 Headache, unspecified: Secondary | ICD-10-CM | POA: Insufficient documentation

## 2022-06-22 DIAGNOSIS — I10 Essential (primary) hypertension: Secondary | ICD-10-CM

## 2022-06-22 DIAGNOSIS — E782 Mixed hyperlipidemia: Secondary | ICD-10-CM

## 2022-06-22 DIAGNOSIS — F419 Anxiety disorder, unspecified: Secondary | ICD-10-CM

## 2022-06-22 DIAGNOSIS — R7309 Other abnormal glucose: Secondary | ICD-10-CM

## 2022-06-22 DIAGNOSIS — F32A Depression, unspecified: Secondary | ICD-10-CM

## 2022-06-22 DIAGNOSIS — R739 Hyperglycemia, unspecified: Secondary | ICD-10-CM

## 2022-06-22 DIAGNOSIS — F410 Panic disorder [episodic paroxysmal anxiety] without agoraphobia: Secondary | ICD-10-CM

## 2022-06-22 DIAGNOSIS — G2581 Restless legs syndrome: Secondary | ICD-10-CM

## 2022-06-22 DIAGNOSIS — K219 Gastro-esophageal reflux disease without esophagitis: Secondary | ICD-10-CM | POA: Diagnosis not present

## 2022-06-22 DIAGNOSIS — E278 Other specified disorders of adrenal gland: Secondary | ICD-10-CM | POA: Diagnosis not present

## 2022-06-22 MED ORDER — HYDROXYZINE HCL 10 MG PO TABS: 10.0000 mg | ORAL_TABLET | Freq: Three times a day (TID) | ORAL | 0 refills | Status: DC | PRN

## 2022-06-22 MED ORDER — PRAMIPEXOLE DIHYDROCHLORIDE 0.5 MG PO TABS: 0.5000 mg | ORAL_TABLET | Freq: Every evening | ORAL | 1 refills | Status: DC | PRN

## 2022-06-22 NOTE — Patient Instructions (Signed)

## 2022-06-22 NOTE — Assessment & Plan Note (Addendum)
Will give Rx for Mirapex 0.5 mg nightly.

## 2022-06-22 NOTE — Assessment & Plan Note (Addendum)
Continue paroxetine  We will add hydroxyzine 10 mg every 8 hours as needed for panic Support offered

## 2022-06-22 NOTE — Assessment & Plan Note (Signed)
No longer monitoring due to stability

## 2022-06-22 NOTE — Assessment & Plan Note (Signed)
Controlled off meds, will monitor

## 2022-06-22 NOTE — Assessment & Plan Note (Addendum)
She declines lab work today Encouraged her to consume a low fat diet Continue atorvastatin

## 2022-06-22 NOTE — Progress Notes (Signed)
Subjective:    Patient ID: Tasha Ferguson, female    DOB: [birth year], 52 y.o.   MRN: [id number]  HPI  Patient presents to clinic today for 65-month follow-up of chronic conditions.  Anxiety, Depression and Panic Attacks: Chronic.  Managed on Paroxetine. She does feel like she is struggling with panic attacks. She is not currently seeing a therapist.  She denies SI/HI.  HLD: Her last LDL was 150, triglycerides 177, 05/2021.  She denies myalgias on Atorvastatin .  She does not consume low-fat diet.  RLS: This has reoccurred but not as bad as before.  She is not currently taking any medications for this.  She is not following with neurology.  History of Left Adrenal Mass: CT abdomen from 2014 reviewed.  She has not had any further follow-up on this.  GERD: Resolved with weight loss.  She is not currently taking any medications for this.  There is no upper GI on file.  HTN: Her BP today is 134/76.  She is not taking any antihypertensive medications at this time.  ECG from 09/2020 reviewed.   Review of Systems     Past Medical History:  Diagnosis Date   Anxiety    Breast mass in female    R side calcifiction   Depression    Mass    adernal gland being followed by urologist    Current Outpatient Medications  Medication Sig Dispense Refill   acetaminophen (TYLENOL) 500 MG tablet Take 500 mg by mouth every 6 (six) hours as needed.     atorvastatin (LIPITOR) 10 MG tablet TAKE 1 TABLET BY MOUTH EVERY DAY 90 tablet 1   PARoxetine (PAXIL-CR) 12.5 MG 24 hr tablet TAKE 1 TABLET BY MOUTH EVERY DAY 90 tablet 3   No current facility-administered medications for this visit.    Allergies  Allergen Reactions   Codeine Nausea Only    Nausea and vomiting    Family History  Problem Relation Age of Onset   Hypertension Mother    Depression Mother    Diabetes Father    Alcohol abuse Father    Anxiety disorder Father    Depression Father    Alcohol abuse Sister    Alcohol abuse Brother     Drug abuse Brother     Social History   Socioeconomic History   Marital status: Married    Spouse name: Not on file   Number of children: Not on file   Years of education: Not on file   Highest education level: Not on file  Occupational History   Not on file  Tobacco Use   Smoking status: Former    Types: E-cigarettes   Smokeless tobacco: Never  Vaping Use   Vaping Use: Every day   Substances: Nicotine  Substance and Sexual Activity   Alcohol use: No    Alcohol/week: 0.0 standard drinks of alcohol   Drug use: No   Sexual activity: Yes  Other Topics Concern   Not on file  Social History Narrative   Not on file   Social Determinants of Health   Financial Resource Strain: Not on file  Food Insecurity: Not on file  Transportation Needs: Not on file  Physical Activity: Not on file  Stress: Not on file  Social Connections: Not on file  Intimate Partner Violence: Not on file     Constitutional:  Denies fever, malaise, fatigue, headaches or or abrupt weight changes.  HEENT: Denies eye pain, eye redness, ear pain, ringing in  the ears, wax buildup, runny nose, nasal congestion, bloody nose, or sore throat. Respiratory: Denies difficulty breathing, shortness of breath, cough or sputum production.   Cardiovascular: Denies chest pain, chest tightness, palpitations or swelling in the hands or feet.  Gastrointestinal: Denies abdominal pain, bloating, constipation, diarrhea or blood in the stool.  GU: Denies urgency, frequency, pain with urination, burning sensation, blood in urine, odor or discharge. Musculoskeletal: Denies decrease in range of motion, difficulty with gait, muscle pain or joint pain and swelling.  Skin: Denies redness, rashes, lesions or ulcercations.  Neurological: Patient reports restless legs.  Denies dizziness, difficulty with memory, difficulty with speech or problems with balance and coordination.  Psych: Patient has a history of anxiety and depression.   Denies SI/HI.  No other specific complaints in a complete review of systems (except as listed in HPI above).  Objective:   Physical Exam   BP 134/76 (BP Location: Left Arm, Patient Position: Sitting, Cuff Size: Normal)   Pulse 97   Temp (!) 96.6 F (35.9 C) (Temporal)   Wt 158 lb (71.7 kg)   LMP 04/07/2019   SpO2 100%   BMI 23.33 kg/m   Wt Readings from Last 3 Encounters:  11/27/21 141 lb (64 kg)  05/19/21 143 lb (64.9 kg)  09/22/20 180 lb (81.6 kg)    General: Appears her stated age, well developed, well nourished in NAD. Skin: Warm, dry and intact.  HEENT: Head: normal shape and size; Eyes: sclera white, no icterus, conjunctiva pink, PERRLA and EOMs intact;  Cardiovascular: Normal rate and rhythm. S1,S2 noted.  No murmur, rubs or gallops noted. No JVD or BLE edema. No carotid bruits noted. Pulmonary/Chest: Normal effort and positive vesicular breath sounds. No respiratory distress. No wheezes, rales or ronchi noted.  Abdomen:  Normal bowel sounds.  Musculoskeletal:  No difficulty with gait.  Neurological: Alert and oriented. Coordination normal.  Psychiatric: Mood and affect normal.  Anxious appearing. Judgment and thought content normal.    BMET    Component Value Date/Time   NA 138 05/19/2021 0906   K 4.1 05/19/2021 0906   CL 104 05/19/2021 0906   CO2 25 05/19/2021 0906   GLUCOSE 104 05/19/2021 0906   BUN 15 05/19/2021 0906   CREATININE 0.95 05/19/2021 0906   CALCIUM 9.9 05/19/2021 0906   GFRNONAA >60 09/22/2020 1116   GFRNONAA 72 12/06/2019 0938   GFRAA 84 12/06/2019 0938    Lipid Panel     Component Value Date/Time   CHOL 226 (H) 05/19/2021 0906   TRIG 177 (H) 05/19/2021 0906   HDL 45 (L) 05/19/2021 0906   CHOLHDL 5.0 (H) 05/19/2021 0906   LDLCALC 150 (H) 05/19/2021 0906    CBC    Component Value Date/Time   WBC 6.0 05/19/2021 0906   RBC 4.72 05/19/2021 0906   HGB 14.9 05/19/2021 0906   HCT 43.7 05/19/2021 0906   PLT 271 05/19/2021 0906   MCV  92.6 05/19/2021 0906   MCH 31.6 05/19/2021 0906   MCHC 34.1 05/19/2021 0906   RDW 11.8 05/19/2021 0906   LYMPHSABS 2.2 09/22/2020 1116   MONOABS 0.9 09/22/2020 1116   EOSABS 0.2 09/22/2020 1116   BASOSABS 0.1 09/22/2020 1116    Hgb A1C No results found for: "HGBA1C"         Assessment & Plan:     RTC in 6 months for your annual exam Nicki Reaper, NP

## 2022-06-22 NOTE — Assessment & Plan Note (Signed)
Currently not an issue Will monitor 

## 2022-06-22 NOTE — Assessment & Plan Note (Signed)
Stable on her current dose of paroxetine Support offered

## 2022-07-15 ENCOUNTER — Other Ambulatory Visit: Payer: Self-pay | Admitting: Internal Medicine

## 2022-07-16 NOTE — Telephone Encounter (Signed)
Requested Prescriptions  Pending Prescriptions Disp Refills   pramipexole (MIRAPEX) 0.5 MG tablet [Pharmacy Med Name: PRAMIPEXOLE 0.5 MG TABLET] 90 tablet 1    Sig: TAKE 1 TABLET (0.5 MG TOTAL) BY MOUTH AT BEDTIME AS NEEDED.     Neurology:  Parkinsonian Agents Passed - 07/15/2022  2:33 PM      Passed - Last BP in normal range    BP Readings from Last 1 Encounters:  06/22/22 134/76         Passed - Last Heart Rate in normal range    Pulse Readings from Last 1 Encounters:  06/22/22 97         Passed - Valid encounter within last 12 months    Recent Outpatient Visits           3 weeks ago Anxiety and depression   [facility] [facility] [place], Salvadore Oxford, NP   7 months ago Encounter for general adult medical examination with abnormal findings   [place] [facility] [place], Salvadore Oxford, NP   1 year ago Lightheadedness   [facility] [place], Salvadore Oxford, NP   1 year ago Left adrenal mass [facility])   [facility], Salvadore Oxford, NP   2 years ago Annual physical exam   [place] [facility], Jodelle Gross, FNP       Future Appointments             In 5 months Baity, Salvadore Oxford, NP [place] [facility], [facility]

## 2022-08-04 ENCOUNTER — Other Ambulatory Visit: Payer: Self-pay | Admitting: Internal Medicine

## 2022-08-04 NOTE — Telephone Encounter (Signed)
Requested medication (s) are due for refill today: Due 08/10/22  Requested medication (s) are on the active medication list: yes    Last refill:  02/09/22  #90 1 refill  Future visit scheduled yes 12/23/22  Notes to clinic:Failed due to labs, please review. Thank you.  Requested Prescriptions  Pending Prescriptions Disp Refills   atorvastatin (LIPITOR) 10 MG tablet [Pharmacy Med Name: ATORVASTATIN 10 MG TABLET] 90 tablet 1    Sig: TAKE 1 TABLET BY MOUTH EVERY DAY     Cardiovascular:  Antilipid - Statins Failed - 08/04/2022  2:30 AM      Failed - Lipid Panel in normal range within the last 12 months    Cholesterol  Date Value Ref Range Status  05/19/2021 226 (H) <200 mg/dL Final   LDL Cholesterol (Calc)  Date Value Ref Range Status  05/19/2021 150 (H) mg/dL (calc) Final    Comment:    Reference range: <100 . Desirable range <100 mg/dL for primary prevention;   <70 mg/dL for patients with CHD or diabetic patients  with > or = 2 CHD risk factors. Marland Kitchen LDL-C is now calculated using the Martin-Hopkins  calculation, which is a validated novel method providing  better accuracy than the Friedewald equation in the  estimation of LDL-C.  Horald Pollen et al. Lenox Ahr. 7829;562(13): 2061-2068  (http://education.[facility].com/faq/FAQ164)    HDL  Date Value Ref Range Status  05/19/2021 45 (L) > OR = 50 mg/dL Final   Triglycerides  Date Value Ref Range Status  05/19/2021 177 (H) <150 mg/dL Final         Passed - Patient is not pregnant      Passed - Valid encounter within last 12 months    Recent Outpatient Visits           1 month ago Anxiety and depression   [place] [facility] [place], Salvadore Oxford, NP   8 months ago Encounter for general adult medical examination with abnormal findings   [place] [facility] [place], Salvadore Oxford, NP   1 year ago Lightheadedness   [place] [facility] [place], Salvadore Oxford, NP   1 year ago Left  adrenal mass [facility])   [place] [facility] [place], Salvadore Oxford, NP   2 years ago Annual physical exam   [place] [facility], Jodelle Gross, FNP       Future Appointments             In 4 months Baity, Salvadore Oxford, NP [place] [facility], [facility]

## 2022-12-23 ENCOUNTER — Encounter: Payer: Commercial Managed Care - PPO | Admitting: Internal Medicine

## 2023-01-09 ENCOUNTER — Other Ambulatory Visit: Payer: Self-pay | Admitting: Internal Medicine

## 2023-01-10 NOTE — Telephone Encounter (Signed)
Requested Prescriptions  Pending Prescriptions Disp Refills   pramipexole (MIRAPEX) 0.5 MG tablet [Pharmacy Med Name: PRAMIPEXOLE 0.5 MG TABLET] 90 tablet 1    Sig: TAKE 1 TABLET (0.5 MG TOTAL) BY MOUTH AT BEDTIME AS NEEDED.     Neurology:  Parkinsonian Agents Passed - 01/09/2023  1:29 AM      Passed - Last BP in normal range    BP Readings from Last 1 Encounters:  06/22/22 134/76         Passed - Last Heart Rate in normal range    Pulse Readings from Last 1 Encounters:  06/22/22 97         Passed - Valid encounter within last 12 months    Recent Outpatient Visits           6 months ago Anxiety and depression   [place] [facility] [place], Salvadore Oxford, NP   1 year ago Encounter for general adult medical examination with abnormal findings   [place] [facility], Salvadore Oxford, NP   1 year ago Lightheadedness   [place] [facility] [place], Salvadore Oxford, NP   1 year ago Left adrenal mass [facility])   [place] [facility] [place], Salvadore Oxford, NP   3 years ago Annual physical exam   [place] [facility], Jodelle Gross, FNP       Future Appointments             In 1 week Baity, Salvadore Oxford, NP [place] [facility], [facility]

## 2023-01-19 ENCOUNTER — Encounter: Payer: Commercial Managed Care - PPO | Admitting: Internal Medicine

## 2023-01-25 ENCOUNTER — Telehealth: Payer: Self-pay

## 2023-01-25 ENCOUNTER — Ambulatory Visit: Payer: Self-pay

## 2023-01-25 NOTE — Telephone Encounter (Signed)
Copied from CRM [id number]. Topic: General - Inquiry >> Jan 25, 2023 12:52 PM De Blanch wrote: Reason for CRM: Pt stated PCP wrote her a letter last year 2023 for jury duty and to be excused permanently but she has jury duty again. Pt is requesting a new letter. Transferred to [place] for anxiety.   Please advise.

## 2023-01-25 NOTE — Telephone Encounter (Signed)
Chief Complaint: Anxiety Symptoms: Anxious, overwhelmed, nervous   Frequency: comes and goes  Pertinent Negatives: Patient denies thoughts of self harm, pain, chest pain,  Disposition: [] ED /[] Urgent Care (no appt availability in office) / [x] Appointment(In office/virtual)/ []  [place] [facility]/ [] Home Care/ [] Refused Recommended Disposition /[] [place] [facility]/ []  Follow-up with PCP Additional Notes: Patient stated she received a letter for [facility] duty and she submit her medical letter to be excused from [place] duty. The letter was not accepted by the [employer] due to the letter being old. Patient stated this is given her increased anxiety. She cannot go to [place] duty like this. Patient is requesting an undated letter to submit to the [employer] to get out of [employer] duty. Care advice given and patient advised I would forward request to PCP for additional recommendations. Patient has an appointment scheduled 02/02/23 with PCP for physical and would like to discuss medication at that time.   Jury Duty File Number [phone] Jury number 3. [facility] Mar 02 2023 Wednesday  Reason for Disposition  [1] Symptoms of anxiety or panic attack AND [2] is a chronic symptom (recurrent or ongoing AND present > 4 weeks)  Answer Assessment - Initial Assessment Questions 1. CONCERN: "Did anything happen that prompted you to call today?"      I got a jury duty summit  2. ANXIETY SYMPTOMS: "Can you describe how you (your loved one; patient) have been feeling?" (e.g., tense, restless, panicky, anxious, keyed up, overwhelmed, sense of impending doom).      Overwhelmed and anxious  3. ONSET: "How long have you been feeling this way?" (e.g., hours, days, weeks)     Ongoing for 2-3 years  4. SEVERITY: "How would you rate the level of anxiety?" (e.g., 0 - 10; or mild, moderate, severe).     10/10 5. FUNCTIONAL IMPAIRMENT: "How have these feelings affected your ability to do daily activities?" "Have you had  more difficulty than usual doing your normal daily activities?" (e.g., getting better, same, worse; self-care, school, work, interactions)     It has been a little difficult today the jury duty summit it made it worse today. 6. HISTORY: "Have you felt this way before?" "Have you ever been diagnosed with an anxiety problem in the past?" (e.g., generalized anxiety disorder, panic attacks, PTSD). If Yes, ask: "How was this problem treated?" (e.g., medicines, counseling, etc.)     Yes, going outside gives me a lot anxiety  7. RISK OF HARM - SUICIDAL IDEATION: "Do you ever have thoughts of hurting or killing yourself?" If Yes, ask:  "Do you have these feelings now?" "Do you have a plan on how you would do this?"     No  8. TREATMENT:  "What has been done so far to treat this anxiety?" (e.g., medicines, relaxation strategies). "What has helped?"     Anti-anxiety medication  9. TREATMENT - THERAPIST: "Do you have a counselor or therapist? Name?"     Not right now he moved  10. POTENTIAL TRIGGERS: "Do you drink caffeinated beverages (e.g., coffee, colas, teas), and how much daily?" "Do you drink alcohol or use any drugs?" "Have you started any new medicines recently?"       I drink 2 cups of coffee a day  11. PATIENT SUPPORT: "Who is with you now?" "Who do you live with?" "Do you have family or friends who you can talk to?"        My husband  55. OTHER SYMPTOMS: "  Do you have any other symptoms?" (e.g., feeling depressed, trouble concentrating, trouble sleeping, trouble breathing, palpitations or fast heartbeat, chest pain, sweating, nausea, or diarrhea)       Nausea, mood swing  Protocols used: Anxiety and Panic Attack-A-AH

## 2023-01-26 NOTE — Telephone Encounter (Signed)
Pt called back checked status of note for excusing jury duty, told her of 48-72 hour turn around

## 2023-01-26 NOTE — Telephone Encounter (Signed)
This is a duplicate message.  Please see prior phone note regarding jury duty

## 2023-01-26 NOTE — Telephone Encounter (Signed)
I cannot give her an Teacher, early years/pre.  The letters are formulated in the system with the date and time that the letters are written.  We can discuss this further at her physical if she would like.

## 2023-01-27 NOTE — Telephone Encounter (Signed)
Patient aware of new appointment. No further action needed.

## 2023-01-27 NOTE — Telephone Encounter (Signed)
That is fine, I will discuss this further at her appointment

## 2023-01-28 ENCOUNTER — Encounter: Payer: Self-pay | Admitting: Internal Medicine

## 2023-01-28 ENCOUNTER — Ambulatory Visit: Payer: Commercial Managed Care - PPO | Admitting: Internal Medicine

## 2023-01-28 VITALS — BP 100/60 | Ht 69.0 in | Wt 171.0 lb

## 2023-01-28 DIAGNOSIS — Z0001 Encounter for general adult medical examination with abnormal findings: Secondary | ICD-10-CM | POA: Diagnosis not present

## 2023-01-28 DIAGNOSIS — Z6825 Body mass index (BMI) 25.0-25.9, adult: Secondary | ICD-10-CM

## 2023-01-28 DIAGNOSIS — E663 Overweight: Secondary | ICD-10-CM | POA: Insufficient documentation

## 2023-01-28 DIAGNOSIS — Z1159 Encounter for screening for other viral diseases: Secondary | ICD-10-CM | POA: Diagnosis not present

## 2023-01-28 DIAGNOSIS — E782 Mixed hyperlipidemia: Secondary | ICD-10-CM | POA: Diagnosis not present

## 2023-01-28 DIAGNOSIS — R739 Hyperglycemia, unspecified: Secondary | ICD-10-CM | POA: Diagnosis not present

## 2023-01-28 NOTE — Progress Notes (Signed)
Subjective:    Patient ID: Tasha Ferguson, female    DOB: Dec 11, 1970, 52 y.o.   MRN: [id number]  HPI  Patient presents to clinic today for her annual exam.    Flu: 12/2017 Tetanus: unsure COVID: Pfizer x2 Shingrix: never Pap smear: 11/2019 Mammogram: >2 years ago Colon screening: never Vision screening: as needed Dentist: as needed  Diet: She does eat lean meat. She consumes veggies more than fruits. She tries to avoid fried foods. She drinks mostly water. 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     atorvastatin (LIPITOR) 10 MG tablet TAKE 1 TABLET BY MOUTH EVERY DAY 90 tablet 1   hydrOXYzine (ATARAX) 10 MG tablet Take 1 tablet (10 mg total) by mouth 3 (three) times daily as needed. 30 tablet 0   PARoxetine (PAXIL-CR) 12.5 MG 24 hr tablet TAKE 1 TABLET BY MOUTH EVERY DAY 90 tablet 3   pramipexole (MIRAPEX) 0.5 MG tablet TAKE 1 TABLET (0.5 MG TOTAL) BY MOUTH AT BEDTIME AS NEEDED. 90 tablet 1   No current facility-administered medications for this visit.    Allergies  Allergen Reactions   Codeine Nausea Only    Nausea and vomiting    Family History  Problem Relation Age of Onset   Hypertension Mother    Depression Mother    Diabetes Father    Alcohol abuse Father    Anxiety disorder Father    Depression Father    Alcohol abuse Sister    Alcohol abuse Brother    Drug abuse Brother     Social History   Socioeconomic History   Marital status: Married    Spouse name: Not on file   Number of children: Not on file   Years of education: Not on file   Highest education level: Not on file  Occupational History   Not on file  Tobacco Use    Smoking status: Former    Types: E-cigarettes   Smokeless tobacco: Never  Vaping Use   Vaping status: Every Day   Substances: Nicotine  Substance and Sexual Activity   Alcohol use: No    Alcohol/week: 0.0 standard drinks of alcohol   Drug use: No   Sexual activity: Yes  Other Topics Concern   Not on file  Social History Narrative   Not on file   Social Determinants of Health   Financial Resource Strain: Not on file  Food Insecurity: Not on file  Transportation Needs: Not on file  Physical Activity: Not on file  Stress: Not on file  Social Connections: Not on file  Intimate Partner Violence: Not on file     Constitutional: Denies fever, malaise, fatigue, headache or abrupt weight changes.  HEENT: Denies eye pain, eye redness, ear pain, ringing in the ears, wax buildup, runny nose, nasal congestion, bloody nose, or sore throat. Respiratory: Denies difficulty breathing, shortness of breath, cough or sputum production.   Cardiovascular: Denies chest pain, chest tightness, palpitations or swelling in the hands or feet.  Gastrointestinal: Denies abdominal pain, bloating, constipation, diarrhea or blood in the stool.  GU: Denies urgency,  frequency, pain with urination, burning sensation, blood in urine, odor or discharge. Musculoskeletal: Denies decrease in range of motion, difficulty with gait, muscle pain or joint pain and swelling.  Skin: Denies redness, rashes, lesions or ulcercations.  Neurological: Patient reports restless legs, insomnia.  Denies dizziness, difficulty with memory, difficulty with speech or problems with balance and coordination.  Psych: Patient has a history of anxiety and depression.  Denies anxiety, depression,   No other specific complaints in a complete review of systems (except as listed in HPI above).  Objective:   Physical Exam LMP 04/07/2019  BP 100/60   Ht 5\' 9"  (1.753 m)   Wt 171 lb (77.6 kg)   LMP 04/07/2019   BMI 25.25 kg/m   Wt  Readings from Last 3 Encounters:  06/22/22 158 lb (71.7 kg)  11/27/21 141 lb (64 kg)  05/19/21 143 lb (64.9 kg)    General: Appears her stated age, well developed, well nourished in NAD. Skin: Warm, dry and intact.  HEENT: Head: normal shape and size; Eyes: sclera white, no icterus, conjunctiva pink, PERRLA and EOMs intact;  Neck:  Neck supple, trachea midline. No masses, lumps or thyromegaly present.  Cardiovascular: Tachycardic with normal rhythm. S1,S2 noted.  No murmur, rubs or gallops noted. No JVD or BLE edema. No carotid bruits noted. Pulmonary/Chest: Normal effort and positive vesicular breath sounds. No respiratory distress. No wheezes, rales or ronchi noted.  Abdomen: Normal bowel sounds.  Musculoskeletal: Strength 5/5 BUE/BLE. No difficulty with gait.  Neurological: Alert and oriented. Cranial nerves II-XII grossly intact. Coordination normal.  Psychiatric: Mood and affect normal.  Anxious appearing. Judgment and thought content normal.    BMET    Component Value Date/Time   NA 138 05/19/2021 0906   K 4.1 05/19/2021 0906   CL 104 05/19/2021 0906   CO2 25 05/19/2021 0906   GLUCOSE 104 05/19/2021 0906   BUN 15 05/19/2021 0906   CREATININE 0.95 05/19/2021 0906   CALCIUM 9.9 05/19/2021 0906   GFRNONAA >60 09/22/2020 1116   GFRNONAA 72 12/06/2019 0938   GFRAA 84 12/06/2019 0938    Lipid Panel     Component Value Date/Time   CHOL 226 (H) 05/19/2021 0906   TRIG 177 (H) 05/19/2021 0906   HDL 45 (L) 05/19/2021 0906   CHOLHDL 5.0 (H) 05/19/2021 0906   LDLCALC 150 (H) 05/19/2021 0906    CBC    Component Value Date/Time   WBC 6.0 05/19/2021 0906   RBC 4.72 05/19/2021 0906   HGB 14.9 05/19/2021 0906   HCT 43.7 05/19/2021 0906   PLT 271 05/19/2021 0906   MCV 92.6 05/19/2021 0906   MCH 31.6 05/19/2021 0906   MCHC 34.1 05/19/2021 0906   RDW 11.8 05/19/2021 0906   LYMPHSABS 2.2 09/22/2020 1116   MONOABS 0.9 09/22/2020 1116   EOSABS 0.2 09/22/2020 1116   BASOSABS  0.1 09/22/2020 1116    Hgb A1C No results found for: "HGBA1C"          Assessment & Plan:   Preventative Health Maintenance:  She declines flu shot today She declines tetanus today Encourage her to get her COVID booster Discussed Shingrix vaccine, she will check coverage with her insurance company and schedule a nurse visit if she would like to have this done Pap smear UTD She declines mammogram at this time She declines referral for colon screening of cologuard this time Encouraged her to consume a balanced diet and exercise regimen Advised her to see an eye doctor and  dentist annually Will check CBC, c-Met, lipid, A1c and hep C today  RTC in 6 months, follow-up chronic conditions Nicki Reaper, NP

## 2023-01-28 NOTE — Assessment & Plan Note (Signed)
Encouraged diet and exercise for weight loss ?

## 2023-01-28 NOTE — Patient Instructions (Signed)

## 2023-01-29 ENCOUNTER — Other Ambulatory Visit: Payer: Self-pay | Admitting: Internal Medicine

## 2023-01-29 LAB — COMPLETE METABOLIC PANEL WITH GFR
AG Ratio: 2.3 (calc) (ref 1.0–2.5)
ALT: 17 U/L (ref 6–29)
AST: 15 U/L (ref 10–35)
Albumin: 4.9 g/dL (ref 3.6–5.1)
Alkaline phosphatase (APISO): 71 U/L (ref 37–153)
BUN/Creatinine Ratio: 9 (calc) (ref 6–22)
BUN: 11 mg/dL (ref 7–25)
CO2: 30 mmol/L (ref 20–32)
Calcium: 9.9 mg/dL (ref 8.6–10.4)
Chloride: 103 mmol/L (ref 98–110)
Creat: 1.17 mg/dL — ABNORMAL HIGH (ref 0.50–1.03)
Globulin: 2.1 g/dL (ref 1.9–3.7)
Glucose, Bld: 85 mg/dL (ref 65–139)
Potassium: 4.6 mmol/L (ref 3.5–5.3)
Sodium: 140 mmol/L (ref 135–146)
Total Bilirubin: 0.4 mg/dL (ref 0.2–1.2)
Total Protein: 7 g/dL (ref 6.1–8.1)
eGFR: 56 mL/min/{1.73_m2} — ABNORMAL LOW (ref >=60)

## 2023-01-29 LAB — CBC
HCT: 43 % (ref 35.0–45.0)
Hemoglobin: 14.8 g/dL (ref 11.7–15.5)
MCH: 32.2 pg (ref 27.0–33.0)
MCHC: 34.4 g/dL (ref 32.0–36.0)
MCV: 93.5 fL (ref 80.0–100.0)
MPV: 10.2 fL (ref 7.5–12.5)
Platelets: 246 10*3/uL (ref 140–400)
RBC: 4.6 10*6/uL (ref 3.80–5.10)
RDW: 11.8 % (ref 11.0–15.0)
WBC: 5 10*3/uL (ref 3.8–10.8)

## 2023-01-29 LAB — LIPID PANEL
Cholesterol: 170 mg/dL (ref <200)
HDL: 47 mg/dL — ABNORMAL LOW (ref >=50)
LDL Cholesterol (Calc): 90 mg/dL
Non-HDL Cholesterol (Calc): 123 mg/dL (ref <130)
Total CHOL/HDL Ratio: 3.6 (calc) (ref <5.0)
Triglycerides: 254 mg/dL — ABNORMAL HIGH (ref <150)

## 2023-01-29 LAB — HEMOGLOBIN A1C
Hgb A1c MFr Bld: 5.7 %{Hb} — ABNORMAL HIGH (ref <5.7)
Mean Plasma Glucose: 117 mg/dL
eAG (mmol/L): 6.5 mmol/L

## 2023-01-29 LAB — HEPATITIS C ANTIBODY: Hepatitis C Ab: NONREACTIVE

## 2023-01-31 ENCOUNTER — Telehealth: Payer: Self-pay | Admitting: *Deleted

## 2023-01-31 ENCOUNTER — Other Ambulatory Visit: Payer: Self-pay | Admitting: Internal Medicine

## 2023-01-31 MED ORDER — ATORVASTATIN CALCIUM 20 MG PO TABS: 20.0000 mg | ORAL_TABLET | Freq: Every day | ORAL | Status: DC

## 2023-01-31 NOTE — Telephone Encounter (Signed)
Requested Prescriptions  Pending Prescriptions Disp Refills   PARoxetine (PAXIL-CR) 12.5 MG 24 hr tablet [Pharmacy Med Name: PAROXETINE ER 12.5 MG TABLET] 90 tablet 1    Sig: TAKE 1 TABLET BY MOUTH EVERY DAY     Psychiatry:  Antidepressants - SSRI Passed - 01/29/2023  1:10 AM      Passed - Completed PHQ-2 or PHQ-9 in the last 360 days      Passed - Valid encounter within last 6 months    Recent Outpatient Visits           3 days ago Encounter for general adult medical examination with abnormal findings   [place] [facility] [place], Salvadore Oxford, NP   7 months ago Anxiety and depression   [place] [facility] [place], Salvadore Oxford, NP   1 year ago Encounter for general adult medical examination with abnormal findings   [place] [facility] [place], Salvadore Oxford, NP   1 year ago Lightheadedness   [place] [facility] [place], Salvadore Oxford, NP   1 year ago Left adrenal mass [facility])   [place] [facility] [place], Salvadore Oxford, NP       Future Appointments             In 2 days [place], Salvadore Oxford, NP [place] [facility]             atorvastatin (LIPITOR) 10 MG tablet [Pharmacy Med Name: ATORVASTATIN 10 MG TABLET] 90 tablet 3    Sig: TAKE 1 TABLET BY MOUTH EVERY DAY     Cardiovascular:  Antilipid - Statins Failed - 01/29/2023  1:10 AM      Failed - Lipid Panel in normal range within the last 12 months    Cholesterol  Date Value Ref Range Status  01/28/2023 170 <200 mg/dL Final   LDL Cholesterol (Calc)  Date Value Ref Range Status  01/28/2023 90 mg/dL (calc) Final    Comment:    Reference range: <100 . Desirable range <100 mg/dL for primary prevention;   <70 mg/dL for patients with CHD or diabetic patients  with > or = 2 CHD risk factors. Marland Kitchen LDL-C is now calculated using the Martin-Hopkins  calculation, which is a validated novel method providing  better accuracy than the  Friedewald equation in the  estimation of LDL-C.  Horald Pollen et al. Lenox Ahr. 4098;119(14): 2061-2068  (http://education.[facility].com/faq/FAQ164)    HDL  Date Value Ref Range Status  01/28/2023 47 (L) > OR = 50 mg/dL Final   Triglycerides  Date Value Ref Range Status  01/28/2023 254 (H) <150 mg/dL Final    Comment:    . If a non-fasting specimen was collected, consider repeat triglyceride testing on a fasting specimen if clinically indicated.  Perry Mount et al. J. of Clin. Lipidol. 2015;9:129-169. Marland Kitchen          Passed - Patient is not pregnant      Passed - Valid encounter within last 12 months    Recent Outpatient Visits           3 days ago Encounter for general adult medical examination with abnormal findings   [place] [facility] [place], Salvadore Oxford, NP   7 months ago Anxiety and depression   [place] [facility] [place], Salvadore Oxford, NP   1 year ago Encounter for general adult medical examination with abnormal findings     [facility] [place], Salvadore Oxford, NP   1 year ago Lightheadedness   [place] [facility] [place], Salvadore Oxford, NP   1 year ago Left adrenal mass [facility])   [place] [facility], Salvadore Oxford, NP       Future Appointments             In 2 days Sampson Si, Salvadore Oxford, NP [place] [facility], [facility]

## 2023-01-31 NOTE — Telephone Encounter (Signed)
Patient was advised per [facility] and verbalized understanding.

## 2023-01-31 NOTE — Progress Notes (Signed)
Attempted to reach on 11/18, VM not set up and no answer

## 2023-01-31 NOTE — Telephone Encounter (Signed)
  Chief Complaint: Results Symptoms: NA Frequency: NA Pertinent Negatives: Patient denies NA Disposition: [] ED /[] Urgent Care (no appt availability in office) / [] Appointment(In office/virtual)/ []  [place] [facility]/ [] Home Care/ [] Refused Recommended Disposition /[] [place] [facility]/ []  Follow-up with PCP Additional Notes:  Pt calling for lab results, message from PCP read. Pt states she is agreeable to increase in Atorvastatin. States she has a lot of 10mg  left,  questioning if she can take 2 tabs as ordered until finished.  Advised she can take 2 of the 10mg  tabs, if PCP does not agree she will receive CB.  Pt verbalizes understanding.   "Cholesterol looks much better but your triglycerides remain elevated and your HDL is low.  Would she be willing to increase her atorvastatin to 20 mg daily to try to normalize the triglycerides?  Consume a low saturated fat diet and increasing aerobic exercise can help improve the triglycerides and the HDL.  A1c shows that she is prediabetic.  This means that she is increased risk for developing diabetes within the next 5 years.  She should try to decrease her carbohydrate and sugar intake.  Kidney function is decreased.  She should try to consume 64 ounces of water daily and avoid anti-inflammatories OTC.  Liver function is normal.  Blood counts are normal.  She does not have hepatitis C."

## 2023-01-31 NOTE — Telephone Encounter (Signed)
Ok to take 2 10 mg tabs until she runs out

## 2023-01-31 NOTE — Progress Notes (Signed)
VM not set up.

## 2023-02-02 ENCOUNTER — Encounter: Payer: Commercial Managed Care - PPO | Admitting: Internal Medicine

## 2023-02-02 ENCOUNTER — Telehealth: Payer: Commercial Managed Care - PPO | Admitting: Internal Medicine

## 2023-02-02 ENCOUNTER — Encounter: Payer: Self-pay | Admitting: Internal Medicine

## 2023-02-02 DIAGNOSIS — R7303 Prediabetes: Secondary | ICD-10-CM

## 2023-02-02 DIAGNOSIS — F410 Panic disorder [episodic paroxysmal anxiety] without agoraphobia: Secondary | ICD-10-CM

## 2023-02-02 DIAGNOSIS — F419 Anxiety disorder, unspecified: Secondary | ICD-10-CM

## 2023-02-02 DIAGNOSIS — E785 Hyperlipidemia, unspecified: Secondary | ICD-10-CM | POA: Diagnosis not present

## 2023-02-02 DIAGNOSIS — F32A Depression, unspecified: Secondary | ICD-10-CM

## 2023-02-02 DIAGNOSIS — R944 Abnormal results of kidney function studies: Secondary | ICD-10-CM

## 2023-02-02 DIAGNOSIS — R7989 Other specified abnormal findings of blood chemistry: Secondary | ICD-10-CM

## 2023-02-02 DIAGNOSIS — E782 Mixed hyperlipidemia: Secondary | ICD-10-CM

## 2023-02-02 MED ORDER — BUSPIRONE HCL 15 MG PO TABS: 15.0000 mg | ORAL_TABLET | Freq: Two times a day (BID) | ORAL | 0 refills | Status: DC

## 2023-02-02 NOTE — Patient Instructions (Signed)

## 2023-02-02 NOTE — Progress Notes (Signed)
Virtual Visit via Video Note  I connected with Tasha Ferguson on 02/02/23 at  9:00 AM EST by a video enabled telemedicine application and verified that I am speaking with the correct person using two identifiers.  Location: Patient: Home Provider: Office  Persons participating in this video call: Nicki Reaper, NP and Durenda Hurt   I discussed the limitations of evaluation and management by telemedicine and the availability of in person appointments. The patient expressed understanding and agreed to proceed.  History of Present Illness:  Discussed the use of AI scribe software for clinical note transcription with the patient, who gave verbal consent to proceed.   The patient, with a history of chronic anxiety, presents for a medication review. She reports that her current regimen of hydroxyzine and paroxetine is causing excessive sedation, particularly the hydroxyzine, which she describes as making her feel like a 'zombie.' This is particularly problematic when driving. She has previously been on prozac, but discontinued due to restless leg syndrome.  In addition to her anxiety, the patient has a history of hyperlipidemia and is currently on cholesterol medication. She reports that her nurse advised her to increase her dosage to 20mg .  The patient also mentions a decrease in kidney function and a prediabetic status. She denies taking any over-the-counter anti-inflammatories and attributes her kidney issues to potential dehydration. She also acknowledges a need to reduce her carbohydrate and sugar intake due to her prediabetic status.  Lastly, the patient mentions a previous engagement with a therapist who has since moved out of state. She expresses difficulty in finding a new therapist due to insurance limitations.       Past Medical History:  Diagnosis Date   Anxiety    Breast mass in female    R side calcifiction   Depression    Mass    adernal gland being followed by urologist     Current Outpatient Medications  Medication Sig Dispense Refill   atorvastatin (LIPITOR) 20 MG tablet Take 1 tablet (20 mg total) by mouth daily.     acetaminophen (TYLENOL) 500 MG tablet Take 500 mg by mouth every 6 (six) hours as needed.     hydrOXYzine (ATARAX) 10 MG tablet Take 1 tablet (10 mg total) by mouth 3 (three) times daily as needed. 30 tablet 0   PARoxetine (PAXIL-CR) 12.5 MG 24 hr tablet TAKE 1 TABLET BY MOUTH EVERY DAY 90 tablet 1   pramipexole (MIRAPEX) 0.5 MG tablet TAKE 1 TABLET (0.5 MG TOTAL) BY MOUTH AT BEDTIME AS NEEDED. 90 tablet 1   No current facility-administered medications for this visit.    Allergies  Allergen Reactions   Codeine Nausea Only    Nausea and vomiting    Family History  Problem Relation Age of Onset   Hypertension Mother    Depression Mother    Diabetes Father    Alcohol abuse Father    Anxiety disorder Father    Depression Father    Alcohol abuse Sister    Alcohol abuse Brother    Drug abuse Brother     Social History   Socioeconomic History   Marital status: Married    Spouse name: Not on file   Number of children: Not on file   Years of education: Not on file   Highest education level: Not on file  Occupational History   Not on file  Tobacco Use   Smoking status: Former    Types: E-cigarettes   Smokeless tobacco: Never  Vaping Use  Vaping status: Every Day   Substances: Nicotine  Substance and Sexual Activity   Alcohol use: No    Alcohol/week: 0.0 standard drinks of alcohol   Drug use: No   Sexual activity: Yes  Other Topics Concern   Not on file  Social History Narrative   Not on file   Social Determinants of Health   Financial Resource Strain: Low Risk  (01/28/2023)   Overall Financial Resource Strain (CARDIA)    Difficulty of Paying Living Expenses: Not hard at all  Food Insecurity: No Food Insecurity (01/28/2023)   Hunger Vital Sign    Worried About Running Out of Food in the Last Year: Never true     Ran Out of Food in the Last Year: Never true  Transportation Needs: No Transportation Needs (01/28/2023)   PRAPARE - Administrator, Civil Service (Medical): No    Lack of Transportation (Non-Medical): No  Physical Activity: Insufficiently Active (01/28/2023)   Exercise Vital Sign    Days of Exercise per Week: 2 days    Minutes of Exercise per Session: 40 min  Stress: Stress Concern Present (01/28/2023)   [employer] of Occupational Health - Occupational Stress Questionnaire    Feeling of Stress : Rather much  Social Connections: Socially Isolated (01/28/2023)   Social Connection and Isolation Panel [NHANES]    Frequency of Communication with Friends and Family: Once a week    Frequency of Social Gatherings with Friends and Family: Once a week    Attends Religious Services: Never    Database administrator or Organizations: No    Attends Banker Meetings: Never    Marital Status: Married  Catering manager Violence: Not At Risk (01/28/2023)   Humiliation, Afraid, Rape, and Kick questionnaire    Fear of Current or Ex-Partner: No    Emotionally Abused: No    Physically Abused: No    Sexually Abused: No     Constitutional: Denies fever, malaise, fatigue, headache or abrupt weight changes.  HEENT: Denies eye pain, eye redness, ear pain, ringing in the ears, wax buildup, runny nose, nasal congestion, bloody nose, or sore throat. Respiratory: Denies difficulty breathing, shortness of breath, cough or sputum production.   Cardiovascular: Denies chest pain, chest tightness, palpitations or swelling in the hands or feet.  Gastrointestinal: Denies abdominal pain, bloating, constipation, diarrhea or blood in the stool.  GU: Denies urgency, frequency, pain with urination, burning sensation, blood in urine, odor or discharge. Musculoskeletal: Denies decrease in range of motion, difficulty with gait, muscle pain or joint pain and swelling.  Skin: Denies redness,  rashes, lesions or ulcercations.  Neurological: Patient reports restless legs.  Denies dizziness, difficulty with memory, difficulty with speech or problems with balance and coordination.  Psych: Patient has a history of anxiety and depression.  Denies SI/HI.  No other specific complaints in a complete review of systems (except as listed in HPI above).    Observations/Objective:  Wt Readings from Last 3 Encounters:  01/28/23 171 lb (77.6 kg)  06/22/22 158 lb (71.7 kg)  11/27/21 141 lb (64 kg)    General: Appears her stated age, well developed, well nourished in NAD. Neurological: Alert and oriented.  Psychiatric: Mood and affect normal.  Mildly anxious appearing. Judgment and thought content normal.    BMET    Component Value Date/Time   NA 140 01/28/2023 1351   K 4.6 01/28/2023 1351   CL 103 01/28/2023 1351   CO2 30 01/28/2023 1351  GLUCOSE 85 01/28/2023 1351   BUN 11 01/28/2023 1351   CREATININE 1.17 (H) 01/28/2023 1351   CALCIUM 9.9 01/28/2023 1351   GFRNONAA >60 09/22/2020 1116   GFRNONAA 72 12/06/2019 0938   GFRAA 84 12/06/2019 0938    Lipid Panel     Component Value Date/Time   CHOL 170 01/28/2023 1351   TRIG 254 (H) 01/28/2023 1351   HDL 47 (L) 01/28/2023 1351   CHOLHDL 3.6 01/28/2023 1351   LDLCALC 90 01/28/2023 1351    CBC    Component Value Date/Time   WBC 5.0 01/28/2023 1351   RBC 4.60 01/28/2023 1351   HGB 14.8 01/28/2023 1351   HCT 43.0 01/28/2023 1351   PLT 246 01/28/2023 1351   MCV 93.5 01/28/2023 1351   MCH 32.2 01/28/2023 1351   MCHC 34.4 01/28/2023 1351   RDW 11.8 01/28/2023 1351   LYMPHSABS 2.2 09/22/2020 1116   MONOABS 0.9 09/22/2020 1116   EOSABS 0.2 09/22/2020 1116   BASOSABS 0.1 09/22/2020 1116    Hgb A1C Lab Results  Component Value Date   HGBA1C 5.7 (H) 01/28/2023        Assessment and Plan:  Assessment and Plan    Chronic Anxiety and Depression Current regimen of hydroxyzine and paroxetine causing excessive  sedation, particularly with as-needed hydroxyzine. Prior trial of prozac discontinued due to restless leg syndrome. Discussed options for non-sedating as-needed anxiety medications. -Discontinue hydroxyzine. -Start buspirone 15mg  twice daily. -Consider changing timing of paroxetine to morning if sedation persists.  Hyperlipidemia Recent labs showed slightly elevated triglycerides. Currently on atorvastatin 10mg . -Increase atorvastatin medication to 20mg .  Prediabetes Recent labs indicate prediabetic status. Discussed importance of dietary modifications. -Reduce carbohydrate and sugar intake.  Decreased Kidney Function Recent labs indicate slightly decreased kidney function. Discussed importance of hydration and avoidance of certain over-the-counter medications. -Increase water intake. -Avoid over-the-counter anti-inflammatories.  Therapy Current therapist moved out of state, and patient is unable to see the only other therapist in the area who accepts her insurance due to conflict of interest (therapist is currently seeing patient's husband). -Consider online therapy options such as [email or web address], although this may be cost-prohibitive.    RTC in 6 months, follow-up chronic conditions  Follow Up Instructions:    I discussed the assessment and treatment plan with the patient. The patient was provided an opportunity to ask questions and all were answered. The patient agreed with the plan and demonstrated an understanding of the instructions.   The patient was advised to call back or seek an in-person evaluation if the symptoms worsen or if the condition fails to improve as anticipated.   Nicki Reaper, NP

## 2023-02-20 ENCOUNTER — Encounter: Payer: Self-pay | Admitting: Internal Medicine

## 2023-02-21 MED ORDER — CLOTRIMAZOLE-BETAMETHASONE 1-0.05 % EX CREA: TOPICAL_CREAM | CUTANEOUS | 1 refills | Status: AC

## 2023-03-01 NOTE — Telephone Encounter (Signed)
error 

## 2023-03-02 ENCOUNTER — Encounter: Payer: Self-pay | Admitting: Internal Medicine

## 2023-03-09 ENCOUNTER — Telehealth: Payer: Commercial Managed Care - PPO | Admitting: Physician Assistant

## 2023-03-09 DIAGNOSIS — L509 Urticaria, unspecified: Secondary | ICD-10-CM

## 2023-03-09 NOTE — Patient Instructions (Signed)
Riccardo Dubin, thank you for joining Margaretann Loveless, PA-C for today's virtual visit.  While this provider is not your primary care provider (PCP), if your PCP is located in our provider database this encounter information will be shared with them immediately following your visit.   A [facility] MyChart account gives you access to today's visit and all your visits, tests, and labs performed at [facility] " click here if you don't have a [facility] MyChart account or go to [email or web address]  Consent: (Patient) Tasha Ferguson provided verbal consent for this virtual visit at the beginning of the encounter.  Current Medications:  Current Outpatient Medications:    atorvastatin (LIPITOR) 20 MG tablet, Take 1 tablet (20 mg total) by mouth daily., Disp: , Rfl:    acetaminophen (TYLENOL) 500 MG tablet, Take 500 mg by mouth every 6 (six) hours as needed., Disp: , Rfl:    busPIRone (BUSPAR) 15 MG tablet, Take 1 tablet (15 mg total) by mouth 2 (two) times daily., Disp: 180 tablet, Rfl: 0   clotrimazole-betamethasone (LOTRISONE) cream, Apply 1-2 times a day for worsening flare dry skin dermatitis of toes/feet, may re-use daily up to 1 week as needed., Disp: 30 g, Rfl: 1   PARoxetine (PAXIL-CR) 12.5 MG 24 hr tablet, TAKE 1 TABLET BY MOUTH EVERY DAY, Disp: 90 tablet, Rfl: 1   pramipexole (MIRAPEX) 0.5 MG tablet, TAKE 1 TABLET (0.5 MG TOTAL) BY MOUTH AT BEDTIME AS NEEDED., Disp: 90 tablet, Rfl: 1   Medications ordered in this encounter:  No orders of the defined types were placed in this encounter.    *If you need refills on other medications prior to your next appointment, please contact your pharmacy*  Follow-Up: Call back or seek an in-person evaluation if the symptoms worsen or if the condition fails to improve as anticipated.  [facility] [phone]  Other Instructions - Advised okay to take benadryl (patient has not taken Buspar or Mirapex  medications on list since prescribed); Okay to take with Paroxetine and Atorvastatin - Can also add non-drowsy 24 hr claritin (Loratadine) - Can add Pepcid (Famotidine) 20mg  twice daily - Cool compresses - Cool or Lukewarm showers - Drowsiness precautions discussed with Benadryl and Buspirone (Buspar) or Pramipexole (Mirapex) co-administration and that it was not advised if having to drive - Strict ER precautions verbally discussed if any facial/throat/mouth swelling occur or if shortness of breath or difficulty breathing develop - Follow up if hives continue longer than 72 hours, discussed possible addition of prednisone if conservative measures fail  Hives Hives (urticaria) are itchy, red, swollen areas of skin. They can show up on any part of the body. They often fade within 24 hours (acute hives). If you get new hives after the old ones fade and the cycle goes on for many days or weeks, it is called chronic hives. Hives do not spread from person to person (are not contagious). Hives can happen when your body reacts to something you are allergic to (allergen) or to something that irritates your skin. When you are exposed to something that triggers hives, your body releases a chemical called histamine. This causes redness, itching, and swelling. Hives can show up right after you are exposed to a trigger or hours later. What are the causes? Hives may be caused by: Food allergies. Insect bites or stings. Allergies to pollen or pets. Spending time in sunlight, heat, or cold (exposure). Exercise. Stress. You can also get hives from other  conditions and treatments. These include: Viruses, such as the common cold. Bacterial infections, such as urinary tract infections and strep throat. Certain medicines. Contact with latex or chemicals. Allergy shots. Blood transfusions. In some cases, the cause of hives is not known (idiopathic hives). What increases the risk? You are more likely to get  hives if: You are female. You have food allergies. Hives are more common if you are allergic to citrus fruits, milk, eggs, peanuts, tree nuts, or shellfish. You are allergic to: Medicines. Latex. Insects. Animals. Pollen. What are the signs or symptoms? Common symptoms of hives include raised, itchy, red or white bumps or patches on your skin. These areas may: Become large and swollen (welts). Quickly change shape and location. This may happen more than once. Be separate hives or connect over a large area of skin. Sting or become painful. Turn white when pressed in the center (blanch). In severe cases, your hands, feet, and face may also become swollen. This may happen if hives form deeper in your skin. How is this diagnosed? Hives may be diagnosed based on your symptoms, medical history, and a physical exam. You may have skin, pee (urine), or blood tests done. These can help find out what is causing your hives and rule out other health issues. You may also have a biopsy done. This is when a small piece of skin is removed for testing. How is this treated? Treatment for hives depends on the cause and on how severe your symptoms are. You may be told to use cool, wet cloths (cool compresses) or to take cool showers to relieve itching. Treatment may also include: Medicines to help: Relieve itching (antihistamines). Reduce swelling (corticosteroids). Treat infection (antibiotics). An injectable medicine called omalizumab. You may need this if you have chronic idiopathic hives and still have symptoms even after you are treated with antihistamines. In severe cases, you may need to use a device filled with medicine that gives an emergency shot of epinephrine (auto-injector pen) to prevent a very bad allergic reaction (anaphylactic reaction). Follow these instructions at home: Medicines Take and apply over-the-counter and prescription medicines only as told by your health care provider. If you  were prescribed antibiotics, take them as told by your provider. Do not stop using the antibiotic even if you start to feel better. Skin care Apply cool compresses to the affected areas. Do not scratch or rub your skin. General instructions Do not take hot showers or baths. This can make itching worse. Do not wear tight-fitting clothing. Use sunscreen. Wear protective clothing when you are outside. Avoid anything that causes your hives. Keep a journal to help track what causes your hives. Write down: What medicines you take. What you eat and drink. What products you use on your skin. Keep all follow-up visits. Your provider will track how well treatment is working. Contact a health care provider if: Your symptoms do not get better with medicine. Your joints are painful or swollen. You have a fever. You have pain in your abdomen. Get help right away if: Your tongue, lips, or eyelids swell. Your chest or throat feels tight. You have trouble breathing or swallowing. These symptoms may be an emergency. Use the auto-injector pen right away. Then call 911. Do not wait to see if the symptoms will go away. Do not drive yourself to the hospital. This information is not intended to replace advice given to you by your health care provider. Make sure you discuss any questions you have with your  health care provider. Document Revised: 11/26/2021 Document Reviewed: 11/17/2021 Elsevier Patient Education  2024 Elsevier Inc.    If you have been instructed to have an in-person evaluation today at a local Urgent Care facility, please use the link below. It will take you to a list of all of our available [facility], including address, phone number and hours of operation. Please do not delay care.  [facility]  If you or a family member do not have a primary care provider, use the link below to schedule a visit and establish care. When you choose a [facility] primary care  physician or advanced practice provider, you gain a long-term partner in health. Find a Primary Care Provider  Learn more about [facility] in-office and virtual care options: [facility] - Get Care Now

## 2023-03-09 NOTE — Progress Notes (Signed)
Virtual Visit Consent   [name], you are scheduled for a virtual visit with a [facility] provider today. Just as with appointments in the office, your consent must be obtained to participate. Your consent will be active for this visit and any virtual visit you may have with one of our providers in the next 365 days. If you have a MyChart account, a copy of this consent can be sent to you electronically.  As this is a virtual visit, video technology does not allow for your provider to perform a traditional examination. This may limit your provider's ability to fully assess your condition. If your provider identifies any concerns that need to be evaluated in person or the need to arrange testing (such as labs, EKG, etc.), we will make arrangements to do so. Although advances in technology are sophisticated, we cannot ensure that it will always work on either your end or our end. If the connection with a video visit is poor, the visit may have to be switched to a telephone visit. With either a video or telephone visit, we are not always able to ensure that we have a secure connection.  By engaging in this virtual visit, you consent to the provision of healthcare and authorize for your insurance to be billed (if applicable) for the services provided during this visit. Depending on your insurance coverage, you may receive a charge related to this service.  I need to obtain your verbal consent now. Are you willing to proceed with your visit today? MAHANA Ferguson has provided verbal consent on 03/09/2023 for a virtual visit (video or telephone). Margaretann Loveless, PA-C  Date: 03/09/2023 7:45 PM  Virtual Visit via Video Note   I, Margaretann Loveless, connected with  Tasha Ferguson  ([id number], [birth year]) on 03/09/23 at  7:30 PM EST by a video-enabled telemedicine application and verified that I am speaking with the correct person using two identifiers.  Location: Patient: Virtual Visit Location  Patient: Home Provider: Virtual Visit Location Provider: Home Office   I discussed the limitations of evaluation and management by telemedicine and the availability of in person appointments. The patient expressed understanding and agreed to proceed.    History of Present Illness: Tasha Ferguson is a 52 y.o. who identifies as a female who was assigned female at birth, and is being seen today for hives.  HPI: Urticaria This is a new problem. The current episode started today. The problem is unchanged. The rash is diffuse. The rash is characterized by burning, itchiness and redness. It is unknown if there was an exposure to a precipitant. Pertinent negatives include no anorexia, congestion, cough, facial edema, fatigue, fever, shortness of breath or sore throat. Past treatments include nothing. The treatment provided no relief.     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   Panic attack 02/24/2017   Anxiety and depression 02/18/2015    Allergies:  Allergies  Allergen Reactions   Codeine Nausea Only    Nausea and vomiting   Medications:  Current Outpatient Medications:    atorvastatin (LIPITOR) 20 MG tablet, Take 1 tablet (20 mg total) by mouth daily., Disp: , Rfl:    acetaminophen (TYLENOL) 500  MG tablet, Take 500 mg by mouth every 6 (six) hours as needed., Disp: , Rfl:    busPIRone (BUSPAR) 15 MG tablet, Take 1 tablet (15 mg total) by mouth 2 (two) times daily., Disp: 180 tablet, Rfl: 0   clotrimazole-betamethasone (LOTRISONE) cream, Apply 1-2 times a day for worsening flare dry skin dermatitis of toes/feet, may re-use daily up to 1 week as needed., Disp: 30 g, Rfl: 1   PARoxetine (PAXIL-CR) 12.5 MG 24  hr tablet, TAKE 1 TABLET BY MOUTH EVERY DAY, Disp: 90 tablet, Rfl: 1   pramipexole (MIRAPEX) 0.5 MG tablet, TAKE 1 TABLET (0.5 MG TOTAL) BY MOUTH AT BEDTIME AS NEEDED., Disp: 90 tablet, Rfl: 1  Observations/Objective: Patient is well-developed, well-nourished in no acute distress.  Resting comfortably at home.  Head is normocephalic, atraumatic.  No labored breathing. Speech is clear and coherent with logical content.  Patient is alert and oriented at baseline.    Assessment and Plan: 1. Urticaria (Primary)  - Advised okay to take benadryl (patient has not taken Buspar or Mirapex medications on list since prescribed); Okay to take with Paroxetine and Atorvastatin - Can also add non-drowsy 24 hr claritin (Loratadine) - Can add Pepcid (Famotidine) 20mg  twice daily - Cool compresses - Cool or Lukewarm showers - Drowsiness precautions discussed with Benadryl and Buspirone (Buspar) or Pramipexole (Mirapex) co-administration and that it was not advised if having to drive - Strict ER precautions verbally discussed if any facial/throat/mouth swelling occur or if shortness of breath or difficulty breathing develop - Follow up if hives continue longer than 72 hours, discussed possible addition of prednisone if conservative measures fail   Follow Up Instructions: I discussed the assessment and treatment plan with the patient. The patient was provided an opportunity to ask questions and all were answered. The patient agreed with the plan and demonstrated an understanding of the instructions.  A copy of instructions were sent to the patient via MyChart unless otherwise noted below.    The patient was advised to call back or seek an in-person evaluation if the symptoms worsen or if the condition fails to improve as anticipated.    Margaretann Loveless, PA-C

## 2023-03-21 ENCOUNTER — Telehealth: Payer: Commercial Managed Care - PPO | Admitting: Internal Medicine

## 2023-03-21 ENCOUNTER — Encounter: Payer: Self-pay | Admitting: Internal Medicine

## 2023-03-21 DIAGNOSIS — E270 Other adrenocortical overactivity: Secondary | ICD-10-CM | POA: Diagnosis not present

## 2023-03-21 DIAGNOSIS — F32A Depression, unspecified: Secondary | ICD-10-CM

## 2023-03-21 DIAGNOSIS — F419 Anxiety disorder, unspecified: Secondary | ICD-10-CM

## 2023-03-21 DIAGNOSIS — F410 Panic disorder [episodic paroxysmal anxiety] without agoraphobia: Secondary | ICD-10-CM

## 2023-03-21 DIAGNOSIS — L509 Urticaria, unspecified: Secondary | ICD-10-CM | POA: Diagnosis not present

## 2023-03-21 MED ORDER — PAROXETINE HCL ER 25 MG PO TB24: 25.0000 mg | ORAL_TABLET | Freq: Every day | ORAL | 0 refills | Status: DC

## 2023-03-21 NOTE — Patient Instructions (Signed)

## 2023-03-21 NOTE — Progress Notes (Signed)
 Virtual Visit via Video Note  I connected with Tasha Ferguson on 03/21/23 at 11:20 AM EST by a video enabled telemedicine application and verified that I am speaking with the correct person using two identifiers.  Location: Patient: Home Provider: Office  Persons participating in this video call: Angeline Laura, NP and Tasha Ferguson   I discussed the limitations of evaluation and management by telemedicine and the availability of in person appointments. The patient expressed understanding and agreed to proceed.  History of Present Illness:   Discussed the use of AI scribe software for clinical note transcription with the patient, who gave verbal consent to proceed.  History of Present Illness   The patient, with a history of anxiety and stress, presents with recurrent hives. The first episode occurred during the Christmas holiday, after which they initiated over-the-counter Claritin, which provided relief. They had a second episode of hives recently, prompting this consultation. The patient has been on a low-carb diet, as advised, and questions whether this dietary change or their stress levels could be the cause of the hives.  The patient also reports a history of high cortisol levels, as noted in their blood work from 2023. They have previously been on Prozac  (fluoxetine ), which they believe helped manage their cortisol levels and stress. However, they are currently on Paxil  (paroxetine ) and Buspar  (buspirone ). They express concern about an upcoming situation where their husband will be away for five days, which is causing increased stress due to their close relationship and the fact that they have not been apart for 34 years.  The patient is currently on a low dose of paroxetine  and is compliant with their medication regimen. They have not had hives while on Prozac  in the past, but have experienced them since switching to Paxil . They are interested in exploring options to manage their cortisol  levels and stress, and are open to increasing their dose of paroxetine . They are also taking Claritin daily for the hives.       Past Medical History:  Diagnosis Date   Anxiety    Breast mass in female    R side calcifiction   Depression    Mass    adernal gland being followed by urologist    Current Outpatient Medications  Medication Sig Dispense Refill   atorvastatin  (LIPITOR) 20 MG tablet Take 1 tablet (20 mg total) by mouth daily.     acetaminophen  (TYLENOL ) 500 MG tablet Take 500 mg by mouth every 6 (six) hours as needed.     busPIRone  (BUSPAR ) 15 MG tablet Take 1 tablet (15 mg total) by mouth 2 (two) times daily. 180 tablet 0   clotrimazole -betamethasone  (LOTRISONE ) cream Apply 1-2 times a day for worsening flare dry skin dermatitis of toes/feet, may re-use daily up to 1 week as needed. 30 g 1   PARoxetine  (PAXIL -CR) 12.5 MG 24 hr tablet TAKE 1 TABLET BY MOUTH EVERY DAY 90 tablet 1   pramipexole  (MIRAPEX ) 0.5 MG tablet TAKE 1 TABLET (0.5 MG TOTAL) BY MOUTH AT BEDTIME AS NEEDED. 90 tablet 1   No current facility-administered medications for this visit.    Allergies  Allergen Reactions   Codeine Nausea Only    Nausea and vomiting    Family History  Problem Relation Age of Onset   Hypertension Mother    Depression Mother    Diabetes Father    Alcohol abuse Father    Anxiety disorder Father    Depression Father    Alcohol abuse Sister  Alcohol abuse Brother    Drug abuse Brother     Social History   Socioeconomic History   Marital status: Married    Spouse name: Not on file   Number of children: Not on file   Years of education: Not on file   Highest education level: Not on file  Occupational History   Not on file  Tobacco Use   Smoking status: Former    Types: E-cigarettes   Smokeless tobacco: Never  Vaping Use   Vaping status: Every Day   Substances: Nicotine  Substance and Sexual Activity   Alcohol use: No    Alcohol/week: 0.0 standard drinks of  alcohol   Drug use: No   Sexual activity: Yes  Other Topics Concern   Not on file  Social History Narrative   Not on file   Social Drivers of Health   Financial Resource Strain: Low Risk  (01/28/2023)   Overall Financial Resource Strain (CARDIA)    Difficulty of Paying Living Expenses: Not hard at all  Food Insecurity: No Food Insecurity (01/28/2023)   Hunger Vital Sign    Worried About Running Out of Food in the Last Year: Never true    Ran Out of Food in the Last Year: Never true  Transportation Needs: No Transportation Needs (01/28/2023)   PRAPARE - Administrator, Civil Service (Medical): No    Lack of Transportation (Non-Medical): No  Physical Activity: Insufficiently Active (01/28/2023)   Exercise Vital Sign    Days of Exercise per Week: 2 days    Minutes of Exercise per Session: 40 min  Stress: Stress Concern Present (01/28/2023)   [employer] of Occupational Health - Occupational Stress Questionnaire    Feeling of Stress : Rather much  Social Connections: Socially Isolated (01/28/2023)   Social Connection and Isolation Panel [NHANES]    Frequency of Communication with Friends and Family: Once a week    Frequency of Social Gatherings with Friends and Family: Once a week    Attends Religious Services: Never    Database Administrator or Organizations: No    Attends Banker Meetings: Never    Marital Status: Married  Catering Manager Violence: Not At Risk (01/28/2023)   Humiliation, Afraid, Rape, and Kick questionnaire    Fear of Current or Ex-Partner: No    Emotionally Abused: No    Physically Abused: No    Sexually Abused: No     Constitutional: Denies fever, malaise, fatigue, headache or abrupt weight changes.  HEENT: Denies eye pain, eye redness, ear pain, ringing in the ears, wax buildup, runny nose, nasal congestion, bloody nose, or sore throat. Respiratory: Denies difficulty breathing, shortness of breath, cough or sputum  production.   Cardiovascular: Denies chest pain, chest tightness, palpitations or swelling in the hands or feet.  Gastrointestinal: Denies abdominal pain, bloating, constipation, diarrhea or blood in the stool.  GU: Denies urgency, frequency, pain with urination, burning sensation, blood in urine, odor or discharge. Musculoskeletal: Denies decrease in range of motion, difficulty with gait, muscle pain or joint pain and swelling.  Skin: Pt reports intermittent hives. Denies redness, rashes, lesions or ulcercations.  Neurological: Patient reports restless legs.  Denies dizziness, difficulty with memory, difficulty with speech or problems with balance and coordination.  Psych: Patient has a history of anxiety and depression.  Denies SI/HI.  No other specific complaints in a complete review of systems (except as listed in HPI above).    Observations/Objective:  Wt Readings  from Last 3 Encounters:  01/28/23 171 lb (77.6 kg)  06/22/22 158 lb (71.7 kg)  11/27/21 141 lb (64 kg)    General: Appears her stated age, well developed, well nourished in NAD. Neurological: Alert and oriented.  Psychiatric: Mood and affect normal.  Mildly anxious appearing. Judgment and thought content normal.    BMET    Component Value Date/Time   NA 140 01/28/2023 1351   K 4.6 01/28/2023 1351   CL 103 01/28/2023 1351   CO2 30 01/28/2023 1351   GLUCOSE 85 01/28/2023 1351   BUN 11 01/28/2023 1351   CREATININE 1.17 (H) 01/28/2023 1351   CALCIUM  9.9 01/28/2023 1351   GFRNONAA >60 09/22/2020 1116   GFRNONAA 72 12/06/2019 0938   GFRAA 84 12/06/2019 0938    Lipid Panel     Component Value Date/Time   CHOL 170 01/28/2023 1351   TRIG 254 (H) 01/28/2023 1351   HDL 47 (L) 01/28/2023 1351   CHOLHDL 3.6 01/28/2023 1351   LDLCALC 90 01/28/2023 1351    CBC    Component Value Date/Time   WBC 5.0 01/28/2023 1351   RBC 4.60 01/28/2023 1351   HGB 14.8 01/28/2023 1351   HCT 43.0 01/28/2023 1351   PLT 246  01/28/2023 1351   MCV 93.5 01/28/2023 1351   MCH 32.2 01/28/2023 1351   MCHC 34.4 01/28/2023 1351   RDW 11.8 01/28/2023 1351   LYMPHSABS 2.2 09/22/2020 1116   MONOABS 0.9 09/22/2020 1116   EOSABS 0.2 09/22/2020 1116   BASOSABS 0.1 09/22/2020 1116    Hgb A1C Lab Results  Component Value Date   HGBA1C 5.7 (H) 01/28/2023        Assessment and Plan:  Assessment and Plan    Assessment and Plan    Urticaria Recent onset of hives, possibly related to stress. Responded well to Claritin. -Continue Claritin daily as needed for hives.  Anxiety Increased stress and anxiety due to husband's upcoming travel. Currently on Paroxetine  and Buspar . -Increase Paroxetine  from 12mg  to 25mg  daily. -Continue Buspar  as needed for acute anxiety episodes.  Hypercortisolemia Elevated cortisol level noted on previous lab work, possibly related to chronic stress and anxiety. -Consider repeating cortisol level in the future to assess for persistent hypercortisolism.    RTC in 4 months, follow-up chronic conditions  Follow Up Instructions:    I discussed the assessment and treatment plan with the patient. The patient was provided an opportunity to ask questions and all were answered. The patient agreed with the plan and demonstrated an understanding of the instructions.   The patient was advised to call back or seek an in-person evaluation if the symptoms worsen or if the condition fails to improve as anticipated.   Angeline Laura, NP

## 2023-03-26 IMAGING — CT CT HEAD W/O CM
4 series · 17 of 47 positions shown, 19 images · non-contrast
Comparison: None.

CLINICAL DATA: Syncope, hit head.

EXAM:
CT HEAD WITHOUT CONTRAST
TECHNIQUE: Contiguous axial images were obtained from the base of the skull
through the vertex without intravenous contrast.

[Series 2: head bone · axial · 0.41mm/px · z∈[-67,-17]mm · 4 of 74 slices shown]
[im 8/74  bone]
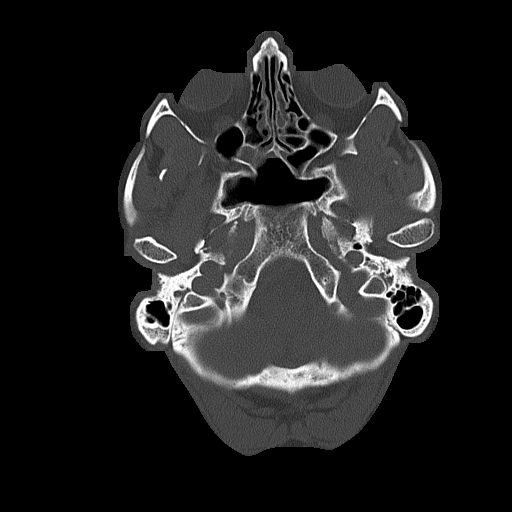
[im 15/74  bone]
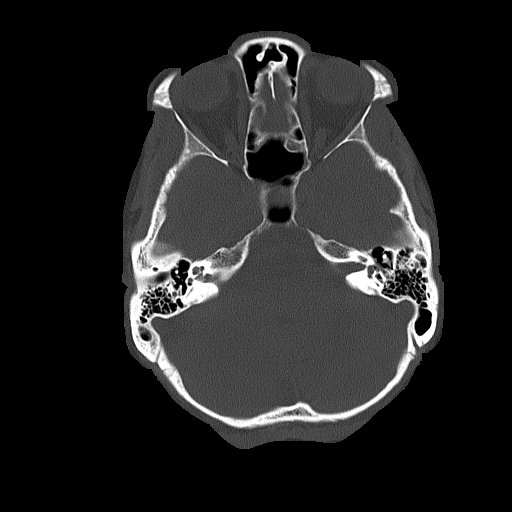
[im 22/74  bone]
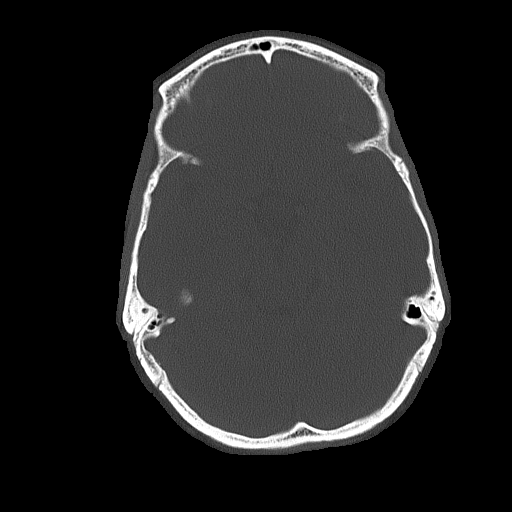
[im 33/74  bone]
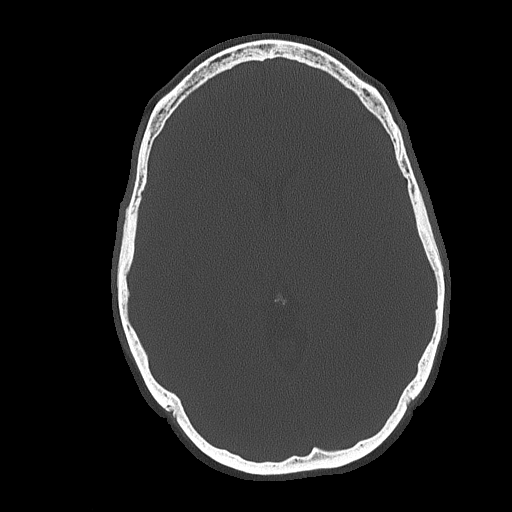

[Series 3: head wo · axial · 0.41mm/px · z∈[-66,+44]mm · 7 of 30 slices shown, 9 images]
[im 4/30  brain]
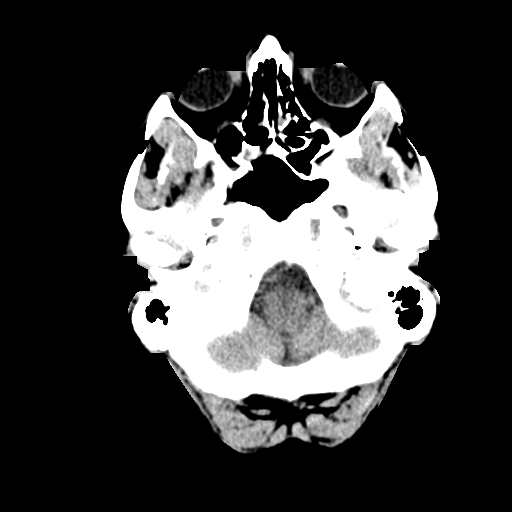
[im 4/30  bone]
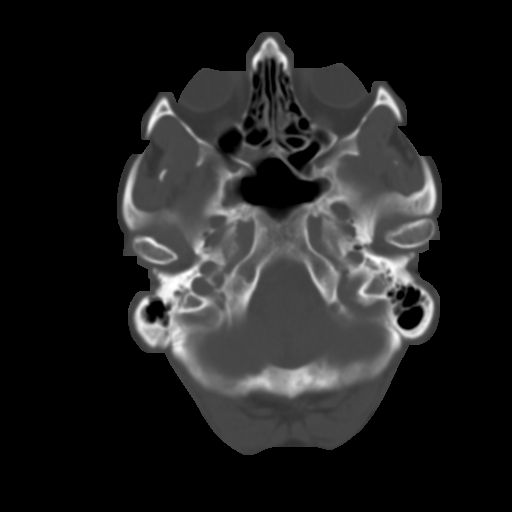
[im 8/30  brain]
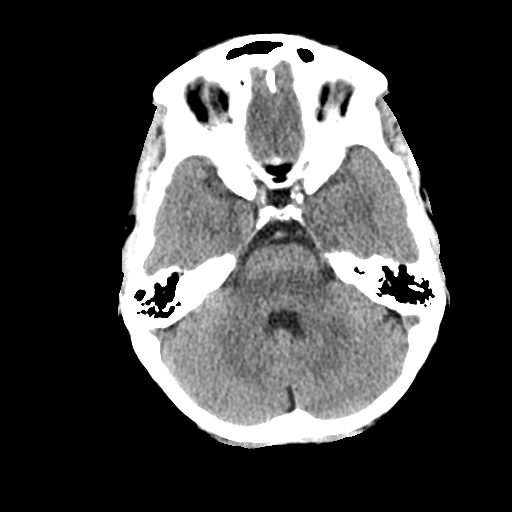
[im 11/30  brain]
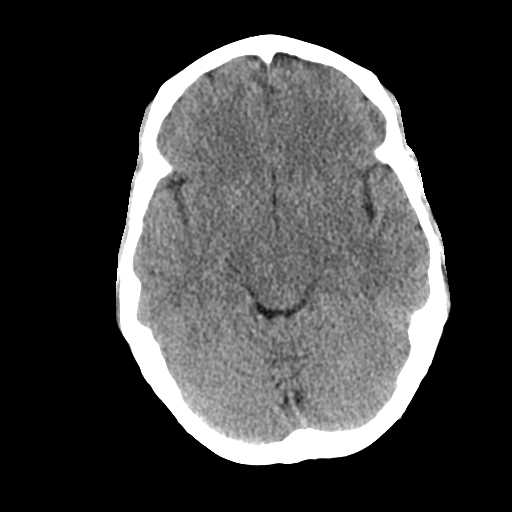
[im 15/30  brain]
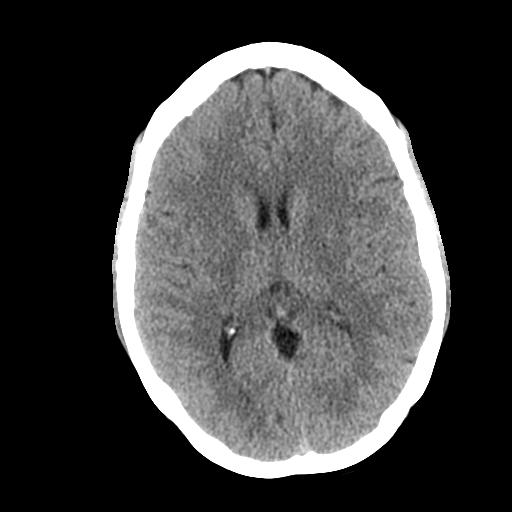
[im 19/30  brain]
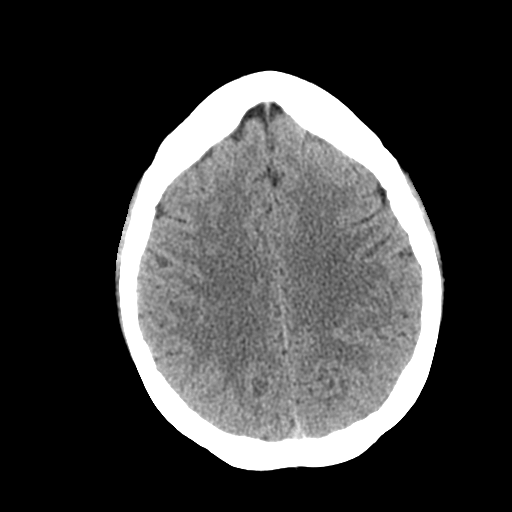
[im 19/30  bone]
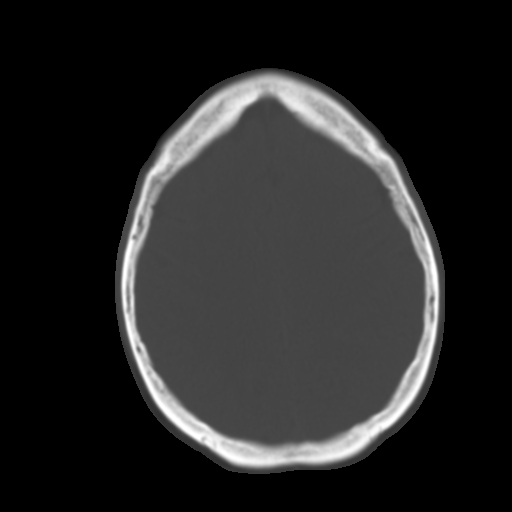
[im 22/30  brain]
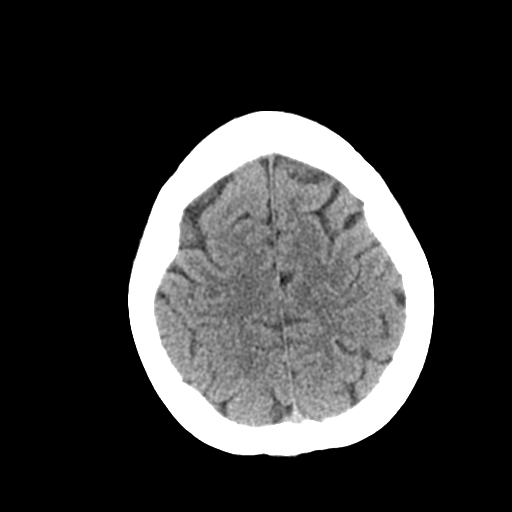
[im 26/30  brain]
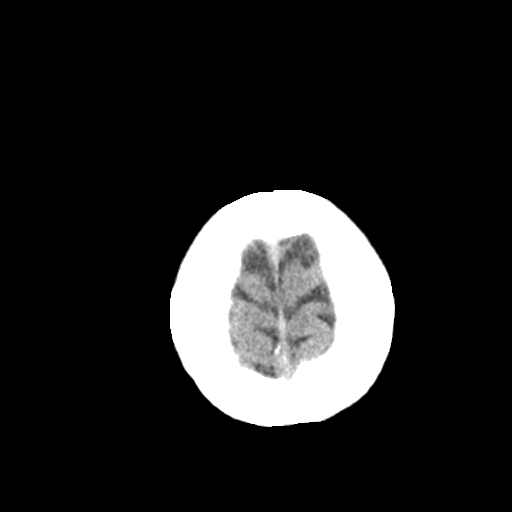

[Series 4: coronal soft tissue · coronal · 0.30mm/px · 3 of 63 slices shown]
[im 21/63  brain]
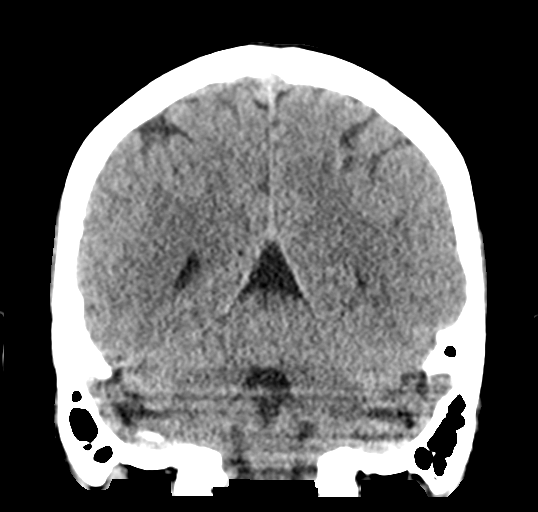
[im 28/63  brain]
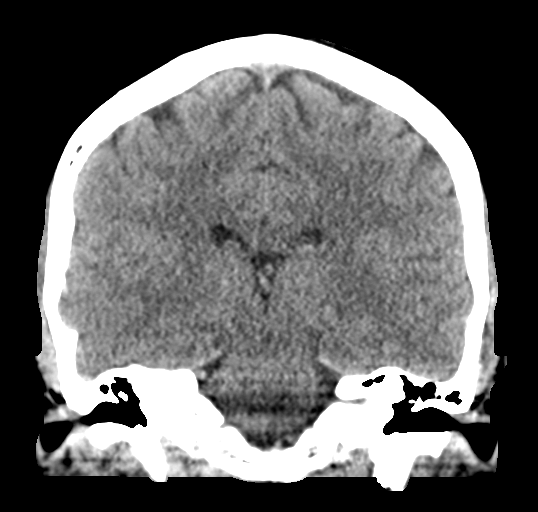
[im 35/63  brain]
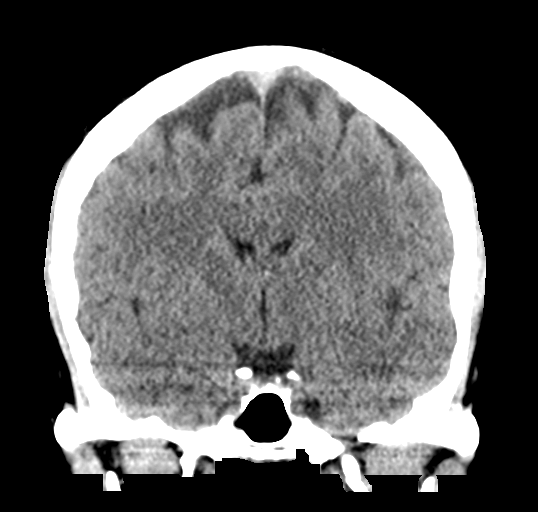

[Series 5: sagittal soft tissue · sagittal · 0.30mm/px · 3 of 55 slices shown]
[im 19/55  brain]
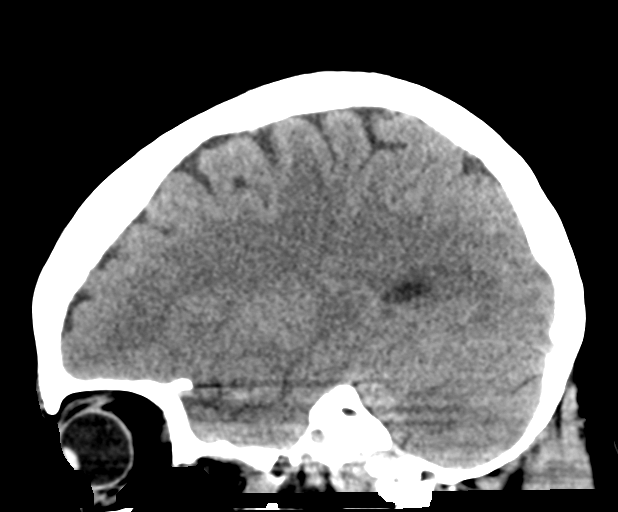
[im 28/55  brain]
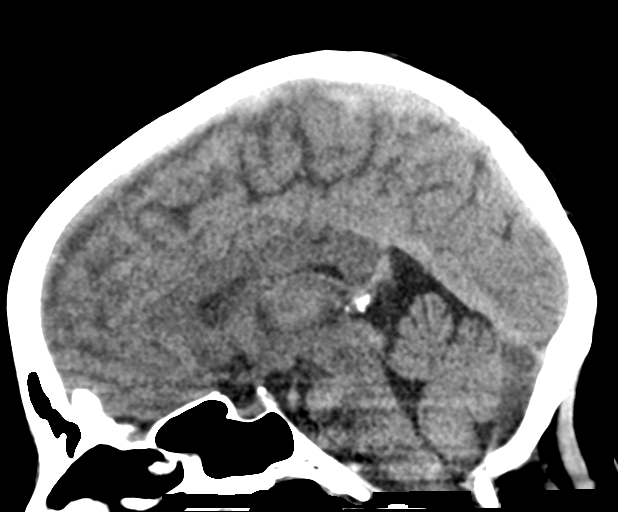
[im 37/55  brain]
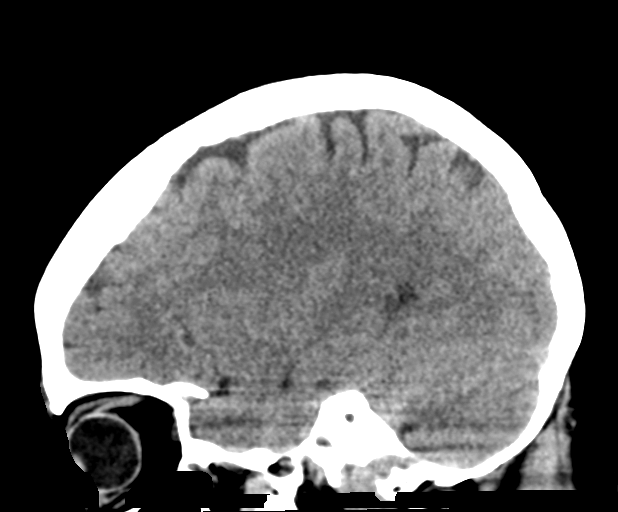

[17 of 47 positions shown; findings below may reference images not displayed]

FINDINGS: Brain: No acute intracranial abnormality. Specifically, no
hemorrhage, hydrocephalus, mass lesion, acute infarction, or
significant intracranial injury.

Vascular: No hyperdense vessel or unexpected calcification.

Skull: No acute calvarial abnormality.

Sinuses/Orbits: No acute findings

Other: None
IMPRESSION: No acute intracranial abnormality.

## 2023-03-31 ENCOUNTER — Encounter: Payer: Self-pay | Admitting: Internal Medicine

## 2023-04-24 ENCOUNTER — Other Ambulatory Visit: Payer: Self-pay | Admitting: Internal Medicine

## 2023-04-25 NOTE — Telephone Encounter (Signed)
 Requested Prescriptions  Pending Prescriptions Disp Refills   busPIRone  (BUSPAR ) 15 MG tablet [Pharmacy Med Name: BUSPIRONE  HCL 15 MG TABLET] 60 tablet 2    Sig: TAKE 1 TABLET BY MOUTH 2 TIMES DAILY.     Psychiatry: Anxiolytics/Hypnotics - Non-controlled Passed - 04/25/2023  2:43 PM      Passed - Valid encounter within last 12 months    Recent Outpatient Visits           1 month ago Anxiety and depression   [place] [facility] [place], Rankin Buzzard, NP   2 months ago Panic attack   [place] [facility] [place], Rankin Buzzard, NP   2 months ago Encounter for general adult medical examination with abnormal findings   [place] [facility] [place], Rankin Buzzard, NP   10 months ago Anxiety and depression   [place] [facility] [place], Rankin Buzzard, NP   1 year ago Encounter for general adult medical examination with abnormal findings   [facility] [place], Rankin Buzzard, NP

## 2023-07-04 ENCOUNTER — Other Ambulatory Visit: Payer: Self-pay

## 2023-07-04 ENCOUNTER — Encounter: Payer: Self-pay | Admitting: Internal Medicine

## 2023-07-04 MED ORDER — ATORVASTATIN CALCIUM 20 MG PO TABS: 20.0000 mg | ORAL_TABLET | Freq: Every day | ORAL | 0 refills | Status: DC

## 2023-07-04 MED ORDER — ATORVASTATIN CALCIUM 20 MG PO TABS: 20.0000 mg | ORAL_TABLET | Freq: Every day | ORAL | Status: DC

## 2023-07-08 ENCOUNTER — Other Ambulatory Visit: Payer: Self-pay | Admitting: Internal Medicine

## 2023-07-08 NOTE — Telephone Encounter (Signed)
 LOV Video 03/21/2023   Requested Prescriptions  Pending Prescriptions Disp Refills   pramipexole  (MIRAPEX ) 0.5 MG tablet [Pharmacy Med Name: PRAMIPEXOLE  0.5 MG TABLET] 90 tablet 0    Sig: TAKE 1 TABLET (0.5 MG TOTAL) BY MOUTH AT BEDTIME AS NEEDED.     Neurology:  Parkinsonian Agents Failed - 07/08/2023  2:05 PM      Failed - Valid encounter within last 12 months    Recent Outpatient Visits   None            Passed - Last BP in normal range    BP Readings from Last 1 Encounters:  01/28/23 100/60         Passed - Last Heart Rate in normal range    Pulse Readings from Last 1 Encounters:  06/22/22 97

## 2023-07-16 ENCOUNTER — Other Ambulatory Visit: Payer: Self-pay | Admitting: Internal Medicine

## 2023-07-18 MED ORDER — PAROXETINE HCL ER 25 MG PO TB24: 25.0000 mg | ORAL_TABLET | Freq: Every day | ORAL | 0 refills | Status: DC

## 2023-07-20 ENCOUNTER — Other Ambulatory Visit: Payer: Self-pay | Admitting: Internal Medicine

## 2023-07-22 NOTE — Telephone Encounter (Signed)
 Requested Prescriptions  Pending Prescriptions Disp Refills   busPIRone  (BUSPAR ) 15 MG tablet [Pharmacy Med Name: BUSPIRONE  HCL 15 MG TABLET] 60 tablet 2    Sig: TAKE 1 TABLET BY MOUTH 2 TIMES DAILY.     Psychiatry: Anxiolytics/Hypnotics - Non-controlled Failed - 07/22/2023  8:48 AM      Failed - Valid encounter within last 12 months    Recent Outpatient Visits   None

## 2023-08-22 ENCOUNTER — Encounter: Payer: Self-pay | Admitting: Internal Medicine

## 2023-08-22 ENCOUNTER — Telehealth: Admitting: Internal Medicine

## 2023-08-22 DIAGNOSIS — Z780 Asymptomatic menopausal state: Secondary | ICD-10-CM | POA: Insufficient documentation

## 2023-08-22 DIAGNOSIS — G2581 Restless legs syndrome: Secondary | ICD-10-CM | POA: Diagnosis not present

## 2023-08-22 DIAGNOSIS — E278 Other specified disorders of adrenal gland: Secondary | ICD-10-CM

## 2023-08-22 DIAGNOSIS — F419 Anxiety disorder, unspecified: Secondary | ICD-10-CM

## 2023-08-22 DIAGNOSIS — R7303 Prediabetes: Secondary | ICD-10-CM

## 2023-08-22 DIAGNOSIS — F32A Depression, unspecified: Secondary | ICD-10-CM

## 2023-08-22 DIAGNOSIS — B001 Herpesviral vesicular dermatitis: Secondary | ICD-10-CM

## 2023-08-22 DIAGNOSIS — I10 Essential (primary) hypertension: Secondary | ICD-10-CM | POA: Diagnosis not present

## 2023-08-22 DIAGNOSIS — K219 Gastro-esophageal reflux disease without esophagitis: Secondary | ICD-10-CM

## 2023-08-22 DIAGNOSIS — E782 Mixed hyperlipidemia: Secondary | ICD-10-CM

## 2023-08-22 DIAGNOSIS — F410 Panic disorder [episodic paroxysmal anxiety] without agoraphobia: Secondary | ICD-10-CM

## 2023-08-22 DIAGNOSIS — E785 Hyperlipidemia, unspecified: Secondary | ICD-10-CM | POA: Diagnosis not present

## 2023-08-22 DIAGNOSIS — N951 Menopausal and female climacteric states: Secondary | ICD-10-CM

## 2023-08-22 MED ORDER — NORGESTIMATE-ETH ESTRADIOL 0.18/0.215/0.25 MG-25 MCG PO TABS: 1.0000 | ORAL_TABLET | Freq: Every day | ORAL | 1 refills | Status: DC

## 2023-08-22 MED ORDER — PAROXETINE HCL ER 37.5 MG PO TB24: 37.5000 mg | ORAL_TABLET | Freq: Every day | ORAL | 1 refills | Status: DC

## 2023-08-22 MED ORDER — VALACYCLOVIR HCL 1 G PO TABS: 1000.0000 mg | ORAL_TABLET | Freq: Every day | ORAL | 1 refills | Status: AC

## 2023-08-22 NOTE — Progress Notes (Signed)
 Virtual Visit via Video Note  I connected with Glady Laming on 08/22/23 at 11:40 AM EDT by a video enabled telemedicine application and verified that I am speaking with the correct person using two identifiers.  Location: Patient: Home Provider: Office  Person's participating in this video call: Helayne Lo, NP-C and Kamira Mellette   I discussed the limitations of evaluation and management by telemedicine and the availability of in person appointments. The patient expressed understanding and agreed to proceed.  History of Present Illness:  Patient due for follow-up of chronic conditions  Anxiety, depression and panic attacks: Chronic.  Managed on paroxetine .  She stopped taking buspirone .  She has been having more mood swings. She is not currently seeing a therapist.  She denies SI/HI.   HLD: Her last LDL was 90, triglycerides 629, 01/2023.  She denies myalgias on atorvastatin  .  She does not consume low-fat diet.   RLS: Symptoms managed with pramipexole  and magnesium solution on her legs.  She is not following with neurology.   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  She has not had any further follow-up on this.   GERD: Resolved with weight loss.  She is not currently taking any medications for this.  There is no upper GI on file.   HTN: She does not check her blood pressure at home. She is not taking any antihypertensive medications at this time.  ECG from 09/2020 reviewed.   Prediabetes: Her last A1c was 5.7%, 01/2023.  She is not taking any oral diabetic medication at this time.  She does not check her sugars.  Menopausal symptoms: She reports decreased libido, mood swings, insomnia, hot flashes and night sweats. She is taking paroxetine  as prescribed. She has also been taking estroven as prescribed.   She also reports cold sores.  This has been over current issue but has worsened over the last 3 weeks.  She has tried Abreva OTC with minimal relief of  symptoms.  Past Medical History:  Diagnosis Date   Anxiety    Breast mass in female    R side calcifiction   Depression    Mass    adernal gland being followed by urologist    Current Outpatient Medications  Medication Sig Dispense Refill   acetaminophen  (TYLENOL ) 500 MG tablet Take 500 mg by mouth every 6 (six) hours as needed.     atorvastatin  (LIPITOR) 20 MG tablet Take 1 tablet (20 mg total) by mouth daily. 90 tablet 0   busPIRone  (BUSPAR ) 15 MG tablet TAKE 1 TABLET BY MOUTH 2 TIMES DAILY. 60 tablet 2   clotrimazole -betamethasone  (LOTRISONE ) cream Apply 1-2 times a day for worsening flare dry skin dermatitis of toes/feet, may re-use daily up to 1 week as needed. 30 g 1   PARoxetine  (PAXIL  CR) 25 MG 24 hr tablet Take 1 tablet (25 mg total) by mouth daily. 90 tablet 0   pramipexole  (MIRAPEX ) 0.5 MG tablet TAKE 1 TABLET (0.5 MG TOTAL) BY MOUTH AT BEDTIME AS NEEDED. 90 tablet 0   No current facility-administered medications for this visit.    Allergies  Allergen Reactions   Codeine Nausea Only    Nausea and vomiting    Family History  Problem Relation Age of Onset   Hypertension Mother    Depression Mother    Diabetes Father    Alcohol abuse Father    Anxiety disorder Father    Depression Father    Alcohol abuse Sister  Alcohol abuse Brother    Drug abuse Brother     Social History   Socioeconomic History   Marital status: Married    Spouse name: Not on file   Number of children: Not on file   Years of education: Not on file   Highest education level: 11th grade  Occupational History   Not on file  Tobacco Use   Smoking status: Former    Types: E-cigarettes   Smokeless tobacco: Never  Vaping Use   Vaping status: Every Day   Substances: Nicotine  Substance and Sexual Activity   Alcohol use: No    Alcohol/week: 0.0 standard drinks of alcohol   Drug use: No   Sexual activity: Yes  Other Topics Concern   Not on file  Social History Narrative   Not on  file   Social Drivers of Health   Financial Resource Strain: Low Risk  (08/22/2023)   Overall Financial Resource Strain (CARDIA)    Difficulty of Paying Living Expenses: Not hard at all  Food Insecurity: No Food Insecurity (08/22/2023)   Hunger Vital Sign    Worried About Running Out of Food in the Last Year: Never true    Ran Out of Food in the Last Year: Never true  Transportation Needs: No Transportation Needs (08/22/2023)   PRAPARE - Administrator, Civil Service (Medical): No    Lack of Transportation (Non-Medical): No  Physical Activity: Insufficiently Active (08/22/2023)   Exercise Vital Sign    Days of Exercise per Week: 3 days    Minutes of Exercise per Session: 20 min  Stress: Stress Concern Present (08/22/2023)   [employer] of Occupational Health - Occupational Stress Questionnaire    Feeling of Stress : Rather much  Social Connections: Socially Isolated (08/22/2023)   Social Connection and Isolation Panel [NHANES]    Frequency of Communication with Friends and Family: Once a week    Frequency of Social Gatherings with Friends and Family: Once a week    Attends Religious Services: Never    Database administrator or Organizations: No    Attends Banker Meetings: Never    Marital Status: Married  Catering manager Violence: Not At Risk (01/28/2023)   Humiliation, Afraid, Rape, and Kick questionnaire    Fear of Current or Ex-Partner: No    Emotionally Abused: No    Physically Abused: No    Sexually Abused: No     Constitutional: Denies fever, malaise, fatigue, headache or abrupt weight changes.  HEENT: Denies eye pain, eye redness, ear pain, ringing in the ears, wax buildup, runny nose, nasal congestion, bloody nose, or sore throat. Respiratory: Denies difficulty breathing, shortness of breath, cough or sputum production.   Cardiovascular: Denies chest pain, chest tightness, palpitations or swelling in the hands or feet.  Gastrointestinal: Denies  abdominal pain, bloating, constipation, diarrhea or blood in the stool.  GU: Denies urgency, frequency, pain with urination, burning sensation, blood in urine, odor or discharge. Musculoskeletal: Denies decrease in range of motion, difficulty with gait, muscle pain or joint pain and swelling.  Skin: Denies redness, rashes, lesions or ulcercations.  Neurological: Patient reports restless legs, insomnia, hot flashes and night sweats.  Denies dizziness, difficulty with memory, difficulty with speech or problems with balance and coordination.  Psych: Patient has a history of anxiety and depression.  Denies SI/HI.  No other specific complaints in a complete review of systems (except as listed in HPI above).  Observations/Objective:  LMP 04/07/2019  Wt Readings from Last 3 Encounters:  01/28/23 171 lb (77.6 kg)  06/22/22 158 lb (71.7 kg)  11/27/21 141 lb (64 kg)    General: Appears her stated age, well developed, well nourished in NAD. Pulmonary/Chest: Normal effort. No respiratory distress.   Neurological: Alert and oriented. Coordination normal.  Psychiatric: Mood and affect normal. Behavior is normal. Judgment and thought content normal.    BMET    Component Value Date/Time   NA 140 01/28/2023 1351   K 4.6 01/28/2023 1351   CL 103 01/28/2023 1351   CO2 30 01/28/2023 1351   GLUCOSE 85 01/28/2023 1351   BUN 11 01/28/2023 1351   CREATININE 1.17 (H) 01/28/2023 1351   CALCIUM  9.9 01/28/2023 1351   GFRNONAA >60 09/22/2020 1116   GFRNONAA 72 12/06/2019 0938   GFRAA 84 12/06/2019 0938    Lipid Panel     Component Value Date/Time   CHOL 170 01/28/2023 1351   TRIG 254 (H) 01/28/2023 1351   HDL 47 (L) 01/28/2023 1351   CHOLHDL 3.6 01/28/2023 1351   LDLCALC 90 01/28/2023 1351    CBC    Component Value Date/Time   WBC 5.0 01/28/2023 1351   RBC 4.60 01/28/2023 1351   HGB 14.8 01/28/2023 1351   HCT 43.0 01/28/2023 1351   PLT 246 01/28/2023 1351   MCV 93.5 01/28/2023 1351    MCH 32.2 01/28/2023 1351   MCHC 34.4 01/28/2023 1351   RDW 11.8 01/28/2023 1351   LYMPHSABS 2.2 09/22/2020 1116   MONOABS 0.9 09/22/2020 1116   EOSABS 0.2 09/22/2020 1116   BASOSABS 0.1 09/22/2020 1116    Hgb A1C Lab Results  Component Value Date   HGBA1C 5.7 (H) 01/28/2023       Assessment and Plan:  RTC in 5 months for your annual exam  Follow Up Instructions:    I discussed the assessment and treatment plan with the patient. The patient was provided an opportunity to ask questions and all were answered. The patient agreed with the plan and demonstrated an understanding of the instructions.   The patient was advised to call back or seek an in-person evaluation if the symptoms worsen or if the condition fails to improve as anticipated.   Helayne Lo, NP

## 2023-08-22 NOTE — Assessment & Plan Note (Signed)
 Will check lipid profile at annual exam Encouraged her to consume a low fat diet Continue atorvastatin  20 mg daily

## 2023-08-22 NOTE — Assessment & Plan Note (Signed)
No longer monitoring due to stability

## 2023-08-22 NOTE — Patient Instructions (Signed)
 Menopause and HRT (Hormone Replacement Therapy): What to Expect Menopause is the time in your life when your menstrual period stops, and your ovaries stop making certain hormones. These hormones are called estrogen and progesterone. Low levels of these hormones can affect your health and cause symptoms. Hormone replacement therapy (HRT) is a treatment that replaces the estrogen and progesterone in your body. Types of HRT  There are many types and doses of HRT. Talk with your health care provider about what form of HRT is best for you. HRT may include: Estrogen and progestin. This is more common if you have a uterus. Estrogen-only therapy. This is used if you don't have a uterus. You may be given the HRT as: Pills. Patches. Gels. Sprays. A vaginal cream. Vaginal rings. Vaginal inserts. How many hormones you need to take and how long you should take them depend on your health. You should: Start HRT at the lowest possible dose. Stop HRT as soon as you're told to. Work with your provider so you feel informed and comfortable with what happens. Tell a health care provider about: Any allergies you have. Whether you've had blood clots or are at risk for blood clots. Whether you or family members have had cancer, such as cancer of: The breasts. The ovaries. The uterus. Any surgeries you've had. Any medical problems you have. All medicines you take. These include vitamins, herbs, eye drops, and creams. Whether you're pregnant or may be pregnant. What are the benefits? HRT can help with the symptoms of menopause. The benefits depend on: What symptoms you have. How bad your symptoms are. Your overall health. Symptoms of menopause that HRT can help with include: Hot flashes and night sweats. Hot flashes are sudden feelings of heat that spread over your face and body. Your skin may turn red, like a blush. Night sweats are hot flashes that happen when you're sleeping or trying to  sleep. Bone loss. The body loses calcium faster after menopause. This can cause your bones to be weaker. They may be more likely to break. Vaginal dryness. The lining of the vagina can get thin and dry, which can cause: Pain during sex. Infection. Burning. Itching. Urinary tract infections. Not being able to control when you pee. Irritability, which means getting annoyed easily. Short-term memory problems. What are the risks? Risks depend on your health and medical history. They also depend on if you get estrogen and progestin or estrogen-only therapy. HRT may make you more likely to have: Spotting. This is when a small amount of blood leaks from your vagina when you don't expect it to. Endometrial cancer. This is cancer of the lining of the uterus. Breast cancer. Higher density of breast tissue. This can make it harder to find breast cancer on an X-ray. A stroke. Heart disease. Blood clots. Gallbladder or liver disease. You may be more at risk for problems if you have: Endometrial cancer. Liver disease. Heart disease. Breast cancer. A history of blood clots. A history of stroke. Follow these instructions at home: Take your medicines only as told. Do not smoke, vape, or use nicotine or tobacco. Go to your provider as often as told to get: Mammograms. Pelvic exams. Medical checkups. Contact a health care provider if: Your legs hurt or swell. You feel lumps or changes in your breasts or armpits. You feel pain, burning, or bleeding when you pee. You have bleeding from your vagina that's not normal. You feel dizzy or get headaches. You have pain in your belly.  Get help right away if: You have shortness of breath. You have chest pain. You have slurred speech. Any part of your arms or legs feels weak or numb. These symptoms may be an emergency. Call 911 right away. Do not wait to see if the symptoms will go away. Do not drive yourself to the hospital. This information is  not intended to replace advice given to you by your health care provider. Make sure you discuss any questions you have with your health care provider. Document Revised: 10/29/2022 Document Reviewed: 10/29/2022 Elsevier Patient Education  2024 [employer].

## 2023-08-22 NOTE — Assessment & Plan Note (Signed)
 Will start valacyclovir 1 g daily for suppression

## 2023-08-22 NOTE — Assessment & Plan Note (Signed)
Will check A1c at annual exam Encouraged low-carb diet

## 2023-08-22 NOTE — Assessment & Plan Note (Signed)
Controlled off meds, will monitor

## 2023-08-22 NOTE — Assessment & Plan Note (Signed)
 Continue pramipexole  0.5 mg nightly and magnesium solution

## 2023-08-22 NOTE — Assessment & Plan Note (Signed)
Currently not an issue Will monitor 

## 2023-08-22 NOTE — Assessment & Plan Note (Signed)
 Will start Ortho Tri-Cyclen Lo to see if this helps improves her symptoms

## 2023-08-22 NOTE — Assessment & Plan Note (Signed)
 Continue paroxetine  37.5 mg daily She stopped buspirone  due to side effects, will discontinue Support offered

## 2023-08-22 NOTE — Assessment & Plan Note (Signed)
 Deteriorated due to menopausal symptoms Will increase paroxetine  CR to 37.5 mg daily Support offered

## 2023-08-23 MED ORDER — HYDROXYZINE PAMOATE 25 MG PO CAPS
25.0000 mg | ORAL_CAPSULE | Freq: Every evening | ORAL | 0 refills | Status: DC | PRN
Start: 2023-08-23 — End: 2023-11-17

## 2023-08-23 NOTE — Addendum Note (Signed)
 Addended by: Carollynn Cirri on: 08/23/2023 08:22 AM   Modules accepted: Orders

## 2023-09-13 ENCOUNTER — Ambulatory Visit: Admitting: Internal Medicine

## 2023-09-13 VITALS — BP 130/84 | Ht 69.0 in | Wt 167.8 lb

## 2023-09-13 DIAGNOSIS — M545 Low back pain, unspecified: Secondary | ICD-10-CM | POA: Diagnosis not present

## 2023-09-13 MED ORDER — METHOCARBAMOL 500 MG PO TABS: 500.0000 mg | ORAL_TABLET | Freq: Three times a day (TID) | ORAL | 0 refills | Status: DC | PRN

## 2023-09-13 NOTE — Progress Notes (Signed)
 Subjective:    Patient ID: Tasha Ferguson, female    DOB: 02-27-1971, 53 y.o.   MRN: [id number]  HPI  Discussed the use of AI scribe software for clinical note transcription with the patient, who gave verbal consent to proceed.  Tasha Ferguson is a 53 year old female who presents with acute lower back pain after lifting a heavy package.  She experienced acute lower back pain after attempting to lift an Surveyor, mining containing dog food on Sunday night. She describes feeling a pull in her lower back at that time. Initially, the pain was bilateral but has since localized to the right lower back. The pain worsens with twisting or bending movements. No radiation of pain down her leg, urinary or vaginal symptoms.   She has been managing the pain with alternating ice and heat applications, which she believes has reduced the broadness of the pain. She has also taken Tylenol  for relief. Stretching and massage exacerbate the pain.   No radiation of pain down her leg, urinary or vaginal symptoms.      Review of Systems     Past Medical History:  Diagnosis Date   Anxiety    Breast mass in female    R side calcifiction   Depression    Mass    adernal gland being followed by urologist    Current Outpatient Medications  Medication Sig Dispense Refill   acetaminophen  (TYLENOL ) 500 MG tablet Take 500 mg by mouth every 6 (six) hours as needed.     atorvastatin  (LIPITOR) 20 MG tablet Take 1 tablet (20 mg total) by mouth daily. 90 tablet 0   clotrimazole -betamethasone  (LOTRISONE ) cream Apply 1-2 times a day for worsening flare dry skin dermatitis of toes/feet, may re-use daily up to 1 week as needed. 30 g 1   hydrOXYzine  (VISTARIL ) 25 MG capsule Take 1 capsule (25 mg total) by mouth at bedtime as needed. 90 capsule 0   Norgestimate -Eth Estradiol  (ORTHO TRI-CYCLEN LO) 0.18/0.215/0.25 MG-25 MCG TABS Take 1 tablet by mouth daily at 12 noon. 90 tablet 1   PARoxetine  (PAXIL  CR) 37.5 MG 24 hr  tablet Take 1 tablet (37.5 mg total) by mouth daily. 90 tablet 1   pramipexole  (MIRAPEX ) 0.5 MG tablet TAKE 1 TABLET (0.5 MG TOTAL) BY MOUTH AT BEDTIME AS NEEDED. 90 tablet 0   valACYclovir  (VALTREX ) 1000 MG tablet Take 1 tablet (1,000 mg total) by mouth daily. 90 tablet 1   No current facility-administered medications for this visit.    Allergies  Allergen Reactions   Codeine Nausea Only    Nausea and vomiting    Family History  Problem Relation Age of Onset   Hypertension Mother    Depression Mother    Diabetes Father    Alcohol abuse Father    Anxiety disorder Father    Depression Father    Alcohol abuse Sister    Alcohol abuse Brother    Drug abuse Brother     Social History   Socioeconomic History   Marital status: Married    Spouse name: Not on file   Number of children: Not on file   Years of education: Not on file   Highest education level: 11th grade  Occupational History   Not on file  Tobacco Use   Smoking status: Former    Types: E-cigarettes   Smokeless tobacco: Never  Vaping Use   Vaping status: Every Day   Substances: Nicotine  Substance and Sexual Activity  Alcohol use: No    Alcohol/week: 0.0 standard drinks of alcohol   Drug use: No   Sexual activity: Yes  Other Topics Concern   Not on file  Social History Narrative   Not on file   Social Drivers of Health   Financial Resource Strain: Low Risk  (09/13/2023)   Overall Financial Resource Strain (CARDIA)    Difficulty of Paying Living Expenses: Not hard at all  Food Insecurity: No Food Insecurity (09/13/2023)   Hunger Vital Sign    Worried About Running Out of Food in the Last Year: Never true    Ran Out of Food in the Last Year: Never true  Transportation Needs: No Transportation Needs (09/13/2023)   PRAPARE - Administrator, Civil Service (Medical): No    Lack of Transportation (Non-Medical): No  Physical Activity: Insufficiently Active (09/13/2023)   Exercise Vital Sign    Days  of Exercise per Week: 3 days    Minutes of Exercise per Session: 20 min  Stress: Stress Concern Present (09/13/2023)   [employer] of Occupational Health - Occupational Stress Questionnaire    Feeling of Stress: To some extent  Social Connections: Socially Isolated (09/13/2023)   Social Connection and Isolation Panel    Frequency of Communication with Friends and Family: Once a week    Frequency of Social Gatherings with Friends and Family: Once a week    Attends Religious Services: Never    Database administrator or Organizations: No    Attends Engineer, structural: Not on file    Marital Status: Married  Catering manager Violence: Not At Risk (01/28/2023)   Humiliation, Afraid, Rape, and Kick questionnaire    Fear of Current or Ex-Partner: No    Emotionally Abused: No    Physically Abused: No    Sexually Abused: No     Constitutional: Denies fever, malaise, fatigue, headache or abrupt weight changes.  Respiratory: Denies difficulty breathing, shortness of breath, cough or sputum production.   Cardiovascular: Denies chest pain, chest tightness, palpitations or swelling in the hands or feet.  Gastrointestinal: Denies abdominal pain, bloating, constipation, diarrhea or blood in the stool.  GU: Denies urgency, frequency, pain with urination, burning sensation, blood in urine, odor or discharge. Musculoskeletal: Patient reports right side low back pain.  Denies difficulty with gait, or joint pain and swelling.  Skin: Denies redness, rashes, lesions or ulcercations.  Neurological: Patient reports restless legs, insomnia.  Denies numbness, tingling, weakness or problems with balance and coordination.    No other specific complaints in a complete review of systems (except as listed in HPI above).  Objective:   Physical Exam BP 130/84 (BP Location: Right Arm, Patient Position: Standing, Cuff Size: Normal)   Ht 5' 9 (1.753 m)   Wt 167 lb 12.8 oz (76.1 kg)   LMP 04/07/2019    BMI 24.78 kg/m     Wt Readings from Last 3 Encounters:  01/28/23 171 lb (77.6 kg)  06/22/22 158 lb (71.7 kg)  11/27/21 141 lb (64 kg)    General: Appears her stated age, well developed, well nourished in NAD. Skin: Warm, dry and intact. No rashes noted. Cardiovascular: Tachycardic with normal rhythm. Pulmonary/Chest: Normal effort and positive vesicular breath sounds. No respiratory distress. No wheezes, rales or ronchi noted.  Musculoskeletal: Decreased flexion, extension, rotation and lateral bending of the spine secondary to pain.  No bony tenderness noted over the spine.  Pain with palpation of the right paralumbar muscles.  She is standing in exam room, does not want to sit down because it increases her discomfort.  Limping gait without device. Neurological: Alert and oriented.   BMET    Component Value Date/Time   NA 140 01/28/2023 1351   K 4.6 01/28/2023 1351   CL 103 01/28/2023 1351   CO2 30 01/28/2023 1351   GLUCOSE 85 01/28/2023 1351   BUN 11 01/28/2023 1351   CREATININE 1.17 (H) 01/28/2023 1351   CALCIUM  9.9 01/28/2023 1351   GFRNONAA >60 09/22/2020 1116   GFRNONAA 72 12/06/2019 0938   GFRAA 84 12/06/2019 0938    Lipid Panel     Component Value Date/Time   CHOL 170 01/28/2023 1351   TRIG 254 (H) 01/28/2023 1351   HDL 47 (L) 01/28/2023 1351   CHOLHDL 3.6 01/28/2023 1351   LDLCALC 90 01/28/2023 1351    CBC    Component Value Date/Time   WBC 5.0 01/28/2023 1351   RBC 4.60 01/28/2023 1351   HGB 14.8 01/28/2023 1351   HCT 43.0 01/28/2023 1351   PLT 246 01/28/2023 1351   MCV 93.5 01/28/2023 1351   MCH 32.2 01/28/2023 1351   MCHC 34.4 01/28/2023 1351   RDW 11.8 01/28/2023 1351   LYMPHSABS 2.2 09/22/2020 1116   MONOABS 0.9 09/22/2020 1116   EOSABS 0.2 09/22/2020 1116   BASOSABS 0.1 09/22/2020 1116    Hgb A1C Lab Results  Component Value Date   HGBA1C 5.7 (H) 01/28/2023            Assessment & Plan:   Assessment and Plan    Acute Low  Back Strain Acute low back strain with localized right lower back pain, no radicular or urinary symptoms. Ice and heat have reduced pain. Methocarbamol and ibuprofen recommended for pain management. - Prescribe methocarbamol 500 mg every 8 hours as needed. - Recommend ibuprofen for pain management if tolerated. - Advise use of heat and stretching exercises. - Instruct to avoid heavy lifting until recovery.      RTC in 4 months for your annual exam Angeline Laura, NP

## 2023-09-13 NOTE — Patient Instructions (Signed)

## 2023-10-01 ENCOUNTER — Other Ambulatory Visit: Payer: Self-pay | Admitting: Internal Medicine

## 2023-10-03 NOTE — Telephone Encounter (Signed)
 Requested Prescriptions  Pending Prescriptions Disp Refills   atorvastatin  (LIPITOR) 20 MG tablet [Pharmacy Med Name: ATORVASTATIN  20 MG TABLET] 90 tablet 0    Sig: TAKE 1 TABLET BY MOUTH EVERY DAY     Cardiovascular:  Antilipid - Statins Failed - 10/03/2023  4:48 PM      Failed - Lipid Panel in normal range within the last 12 months    Cholesterol  Date Value Ref Range Status  01/28/2023 170 <200 mg/dL Final   LDL Cholesterol (Calc)  Date Value Ref Range Status  01/28/2023 90 mg/dL (calc) Final    Comment:    Reference range: <100 . Desirable range <100 mg/dL for primary prevention;   <70 mg/dL for patients with CHD or diabetic patients  with > or = 2 CHD risk factors. SABRA LDL-C is now calculated using the Martin-Hopkins  calculation, which is a validated novel method providing  better accuracy than the Friedewald equation in the  estimation of LDL-C.  Gladis APPLETHWAITE et al. SANDREA. 7986;689(80): 2061-2068  (http://education.[facility].com/faq/FAQ164)    HDL  Date Value Ref Range Status  01/28/2023 47 (L) > OR = 50 mg/dL Final   Triglycerides  Date Value Ref Range Status  01/28/2023 254 (H) <150 mg/dL Final    Comment:    . If a non-fasting specimen was collected, consider repeat triglyceride testing on a fasting specimen if clinically indicated.  Veatrice et al. J. of Clin. Lipidol. 2015;9:129-169. SABRA          Passed - Patient is not pregnant      Passed - Valid encounter within last 12 months    Recent Outpatient Visits           2 weeks ago Acute right-sided low back pain without sciatica   [place] [facility] [place], Angeline ORN, NP   1 month ago Primary hypertension   [place] [facility] [place], Angeline ORN, [place]

## 2023-10-05 ENCOUNTER — Other Ambulatory Visit: Payer: Self-pay | Admitting: Internal Medicine

## 2023-10-06 NOTE — Telephone Encounter (Signed)
 Requested Prescriptions  Pending Prescriptions Disp Refills   pramipexole  (MIRAPEX ) 0.5 MG tablet [Pharmacy Med Name: PRAMIPEXOLE  0.5 MG TABLET] 90 tablet 0    Sig: TAKE 1 TABLET (0.5 MG TOTAL) BY MOUTH AT BEDTIME AS NEEDED.     Neurology:  Parkinsonian Agents Passed - 10/06/2023  3:42 PM      Passed - Last BP in normal range    BP Readings from Last 1 Encounters:  09/13/23 130/84         Passed - Last Heart Rate in normal range    Pulse Readings from Last 1 Encounters:  06/22/22 97         Passed - Valid encounter within last 12 months    Recent Outpatient Visits           3 weeks ago Acute right-sided low back pain without sciatica   [place] [facility] [place], Angeline ORN, NP   1 month ago Primary hypertension   [place] [facility], Angeline ORN, [place]

## 2023-10-29 ENCOUNTER — Encounter: Payer: Self-pay | Admitting: Internal Medicine

## 2023-11-17 ENCOUNTER — Other Ambulatory Visit: Payer: Self-pay | Admitting: Internal Medicine

## 2023-11-17 NOTE — Telephone Encounter (Signed)
 Lab in date.  Requested Prescriptions  Pending Prescriptions Disp Refills   hydrOXYzine  (VISTARIL ) 25 MG capsule [Pharmacy Med Name: HYDROXYZINE  PAM 25 MG CAP] 30 capsule 2    Sig: TAKE 1 CAPSULE (25 MG TOTAL) BY MOUTH AT BEDTIME AS NEEDED.     Ear, Nose, and Throat:  Antihistamines 2 Failed - 11/17/2023  2:05 PM      Failed - Cr in normal range and within 360 days    Creat  Date Value Ref Range Status  01/28/2023 1.17 (H) 0.50 - 1.03 mg/dL Final         Passed - Valid encounter within last 12 months    Recent Outpatient Visits           2 months ago Acute right-sided low back pain without sciatica   [place] [facility] [place], Angeline ORN, NP   2 months ago Primary hypertension   [place] [facility] [place], Angeline ORN, [place]

## 2023-12-29 ENCOUNTER — Other Ambulatory Visit: Payer: Self-pay | Admitting: Internal Medicine

## 2023-12-30 NOTE — Telephone Encounter (Signed)
 Requested Prescriptions  Pending Prescriptions Disp Refills   atorvastatin  (LIPITOR) 20 MG tablet [Pharmacy Med Name: ATORVASTATIN  20 MG TABLET] 90 tablet 0    Sig: TAKE 1 TABLET BY MOUTH EVERY DAY     Cardiovascular:  Antilipid - Statins Failed - 12/30/2023  1:46 PM      Failed - Lipid Panel in normal range within the last 12 months    Cholesterol  Date Value Ref Range Status  01/28/2023 170 <200 mg/dL Final   LDL Cholesterol (Calc)  Date Value Ref Range Status  01/28/2023 90 mg/dL (calc) Final    Comment:    Reference range: <100 . Desirable range <100 mg/dL for primary prevention;   <70 mg/dL for patients with CHD or diabetic patients  with > or = 2 CHD risk factors. SABRA LDL-C is now calculated using the Martin-Hopkins  calculation, which is a validated novel method providing  better accuracy than the Friedewald equation in the  estimation of LDL-C.  Gladis APPLETHWAITE et al. SANDREA. 7986;689(80): 2061-2068  (http://education.[facility].com/faq/FAQ164)    HDL  Date Value Ref Range Status  01/28/2023 47 (L) > OR = 50 mg/dL Final   Triglycerides  Date Value Ref Range Status  01/28/2023 254 (H) <150 mg/dL Final    Comment:    . If a non-fasting specimen was collected, consider repeat triglyceride testing on a fasting specimen if clinically indicated.  Veatrice et al. J. of Clin. Lipidol. 2015;9:129-169. SABRA          Passed - Patient is not pregnant      Passed - Valid encounter within last 12 months    Recent Outpatient Visits           3 months ago Acute right-sided low back pain without sciatica   [place] [facility] [place], Angeline ORN, NP   4 months ago Primary hypertension   [place] [facility] [place], Angeline ORN, [place]

## 2024-01-04 ENCOUNTER — Other Ambulatory Visit: Payer: Self-pay | Admitting: Internal Medicine

## 2024-01-05 NOTE — Telephone Encounter (Signed)
 Requested Prescriptions  Pending Prescriptions Disp Refills   pramipexole  (MIRAPEX ) 0.5 MG tablet [Pharmacy Med Name: PRAMIPEXOLE  0.5 MG TABLET] 90 tablet 0    Sig: TAKE 1 TABLET (0.5 MG TOTAL) BY MOUTH AT BEDTIME AS NEEDED.     Neurology:  Parkinsonian Agents Passed - 01/05/2024  4:03 PM      Passed - Last BP in normal range    BP Readings from Last 1 Encounters:  09/13/23 130/84         Passed - Last Heart Rate in normal range    Pulse Readings from Last 1 Encounters:  06/22/22 97         Passed - Valid encounter within last 12 months    Recent Outpatient Visits           3 months ago Acute right-sided low back pain without sciatica   [place] [facility] [place], Angeline ORN, NP   4 months ago Primary hypertension   [place] [facility] [place], Angeline ORN, [place]

## 2024-02-04 ENCOUNTER — Other Ambulatory Visit: Payer: Self-pay | Admitting: Internal Medicine

## 2024-02-06 NOTE — Telephone Encounter (Signed)
 Requested medication (s) are due for refill today: na   Requested medication (s) are on the active medication list: yes listed under other name- tri-lo-mariza than what was ordered  Last refill:  08/22/23 #90 1 refills   Future visit scheduled: no   Notes to clinic:  do you want to refill Rx?     Requested Prescriptions  Pending Prescriptions Disp Refills   TRI-LO-MARZIA 0.18/0.215/0.25 MG-25 MCG TABS [Pharmacy Med Name: TRI-LO-MARZIA TABLET] 84 tablet 1    Sig: TAKE 1 TABLET BY MOUTH DAILY AT 12 NOON.     OB/GYN:  Contraceptives Passed - 02/06/2024 12:07 PM      Passed - Last BP in normal range    BP Readings from Last 1 Encounters:  09/13/23 130/84         Passed - Valid encounter within last 12 months    Recent Outpatient Visits           4 months ago Acute right-sided low back pain without sciatica   [place] [facility] [place], Angeline ORN, NP   5 months ago Primary hypertension   [place] [facility] [place], Angeline ORN, [place]              Passed - Patient is not a smoker

## 2024-02-12 ENCOUNTER — Other Ambulatory Visit: Payer: Self-pay | Admitting: Internal Medicine

## 2024-02-15 NOTE — Telephone Encounter (Signed)
 Requested Prescriptions  Pending Prescriptions Disp Refills   hydrOXYzine  (VISTARIL ) 25 MG capsule [Pharmacy Med Name: HYDROXYZINE  PAM 25 MG CAP] 90 capsule 0    Sig: TAKE 1 CAPSULE (25 MG TOTAL) BY MOUTH AT BEDTIME AS NEEDED.     Ear, Nose, and Throat:  Antihistamines 2 Failed - 02/15/2024  1:43 PM      Failed - Cr in normal range and within 360 days    Creat  Date Value Ref Range Status  01/28/2023 1.17 (H) 0.50 - 1.03 mg/dL Final         Passed - Valid encounter within last 12 months    Recent Outpatient Visits           5 months ago Acute right-sided low back pain without sciatica   [place] [facility] [place], Angeline ORN, NP   5 months ago Primary hypertension   [place] [facility] [place], Angeline ORN, [place]

## 2024-02-16 ENCOUNTER — Other Ambulatory Visit: Payer: Self-pay | Admitting: Internal Medicine

## 2024-02-16 ENCOUNTER — Encounter: Payer: Self-pay | Admitting: Internal Medicine

## 2024-02-18 NOTE — Telephone Encounter (Signed)
 Requested Prescriptions  Pending Prescriptions Disp Refills   PARoxetine  (PAXIL -CR) 37.5 MG 24 hr tablet [Pharmacy Med Name: PAROXETINE  ER 37.5 MG TABLET] 90 tablet 1    Sig: TAKE 1 TABLET BY MOUTH EVERY DAY     Psychiatry:  Antidepressants - SSRI Failed - 02/18/2024  5:51 PM      Failed - Completed PHQ-2 or PHQ-9 in the last 360 days      Passed - Valid encounter within last 6 months    Recent Outpatient Visits           5 months ago Acute right-sided low back pain without sciatica   [place] [facility] [place], Angeline ORN, NP   6 months ago Primary hypertension   [place] [facility] [place], Angeline ORN, [place]

## 2024-02-20 ENCOUNTER — Ambulatory Visit: Admitting: Internal Medicine

## 2024-02-20 ENCOUNTER — Encounter: Payer: Self-pay | Admitting: Internal Medicine

## 2024-02-20 VITALS — BP 124/80 | Ht 69.0 in | Wt 177.8 lb

## 2024-02-20 DIAGNOSIS — R7303 Prediabetes: Secondary | ICD-10-CM | POA: Diagnosis not present

## 2024-02-20 DIAGNOSIS — E782 Mixed hyperlipidemia: Secondary | ICD-10-CM | POA: Diagnosis not present

## 2024-02-20 DIAGNOSIS — Z0001 Encounter for general adult medical examination with abnormal findings: Secondary | ICD-10-CM

## 2024-02-20 DIAGNOSIS — E663 Overweight: Secondary | ICD-10-CM | POA: Diagnosis not present

## 2024-02-20 DIAGNOSIS — Z6826 Body mass index (BMI) 26.0-26.9, adult: Secondary | ICD-10-CM

## 2024-02-20 MED ORDER — PAROXETINE HCL ER 37.5 MG PO TB24: 37.5000 mg | ORAL_TABLET | Freq: Every day | ORAL | 0 refills | Status: AC

## 2024-02-20 NOTE — Assessment & Plan Note (Signed)
 Encouraged diet and exercise for weight loss ?

## 2024-02-20 NOTE — Patient Instructions (Signed)
 Health Maintenance for Postmenopausal Women Menopause is a normal process in which your ability to get pregnant comes to an end. This process happens slowly over many months or years, usually between the ages of 76 and 38. Menopause is complete when you have missed your menstrual period for 12 months. It is important to talk with your health care provider about some of the most common conditions that affect women after menopause (postmenopausal women). These include heart disease, cancer, and bone loss (osteoporosis). Adopting a healthy lifestyle and getting preventive care can help to promote your health and wellness. The actions you take can also lower your chances of developing some of these common conditions. What are the signs and symptoms of menopause? During menopause, you may have the following symptoms: Hot flashes. These can be moderate or severe. Night sweats. Decrease in sex drive. Mood swings. Headaches. Tiredness (fatigue). Irritability. Memory problems. Problems falling asleep or staying asleep. Talk with your health care provider about treatment options for your symptoms. Do I need hormone replacement therapy? Hormone replacement therapy is effective in treating symptoms that are caused by menopause, such as hot flashes and night sweats. Hormone replacement carries certain risks, especially as you become older. If you are thinking about using estrogen or estrogen with progestin, discuss the benefits and risks with your health care provider. How can I reduce my risk for heart disease and stroke? The risk of heart disease, heart attack, and stroke increases as you age. One of the causes may be a change in the body's hormones during menopause. This can affect how your body uses dietary fats, triglycerides, and cholesterol. Heart attack and stroke are medical emergencies. There are many things that you can do to help prevent heart disease and stroke. Watch your blood pressure High  blood pressure causes heart disease and increases the risk of stroke. This is more likely to develop in people who have high blood pressure readings or are overweight. Have your blood pressure checked: Every 3-5 years if you are 32-23 years of age. Every year if you are 31 years old or older. Eat a healthy diet  Eat a diet that includes plenty of vegetables, fruits, low-fat dairy products, and lean protein. Do not eat a lot of foods that are high in solid fats, added sugars, or sodium. Get regular exercise Get regular exercise. This is one of the most important things you can do for your health. Most adults should: Try to exercise for at least 150 minutes each week. The exercise should increase your heart rate and make you sweat (moderate-intensity exercise). Try to do strengthening exercises at least twice each week. Do these in addition to the moderate-intensity exercise. Spend less time sitting. Even light physical activity can be beneficial. Other tips Work with your health care provider to achieve or maintain a healthy weight. Do not use any products that contain nicotine or tobacco. These products include cigarettes, chewing tobacco, and vaping devices, such as e-cigarettes. If you need help quitting, ask your health care provider. Know your numbers. Ask your health care provider to check your cholesterol and your blood sugar (glucose). Continue to have your blood tested as directed by your health care provider. Do I need screening for cancer? Depending on your health history and family history, you may need to have cancer screenings at different stages of your life. This may include screening for: Breast cancer. Cervical cancer. Lung cancer. Colorectal cancer. What is my risk for osteoporosis? After menopause, you may be  at increased risk for osteoporosis. Osteoporosis is a condition in which bone destruction happens more quickly than new bone creation. To help prevent osteoporosis or  the bone fractures that can happen because of osteoporosis, you may take the following actions: If you are 24-54 years old, get at least 1,000 mg of calcium and at least 600 international units (IU) of vitamin D  per day. If you are older than age 75 but younger than age 30, get at least 1,200 mg of calcium and at least 600 international units (IU) of vitamin D  per day. If you are older than age 8, get at least 1,200 mg of calcium and at least 800 international units (IU) of vitamin D  per day. Smoking and drinking excessive alcohol increase the risk of osteoporosis. Eat foods that are rich in calcium and vitamin D , and do weight-bearing exercises several times each week as directed by your health care provider. How does menopause affect my mental health? Depression may occur at any age, but it is more common as you become older. Common symptoms of depression include: Feeling depressed. Changes in sleep patterns. Changes in appetite or eating patterns. Feeling an overall lack of motivation or enjoyment of activities that you previously enjoyed. Frequent crying spells. Talk with your health care provider if you think that you are experiencing any of these symptoms. General instructions See your health care provider for regular wellness exams and vaccines. This may include: Scheduling regular health, dental, and eye exams. Getting and maintaining your vaccines. These include: Influenza vaccine. Get this vaccine each year before the flu season begins. Pneumonia vaccine. Shingles vaccine. Tetanus, diphtheria, and pertussis (Tdap) booster vaccine. Your health care provider may also recommend other immunizations. Tell your health care provider if you have ever been abused or do not feel safe at home. Summary Menopause is a normal process in which your ability to get pregnant comes to an end. This condition causes hot flashes, night sweats, decreased interest in sex, mood swings, headaches, or lack  of sleep. Treatment for this condition may include hormone replacement therapy. Take actions to keep yourself healthy, including exercising regularly, eating a healthy diet, watching your weight, and checking your blood pressure and blood sugar levels. Get screened for cancer and depression. Make sure that you are up to date with all your vaccines. This information is not intended to replace advice given to you by your health care provider. Make sure you discuss any questions you have with your health care provider. Document Revised: 07/21/2020 Document Reviewed: 07/21/2020 Elsevier Patient Education  2024 [employer].

## 2024-02-20 NOTE — Progress Notes (Signed)
 Subjective:    Patient ID: Tasha Ferguson, female    DOB: 06-Nov-1970, 53 y.o.   MRN: [id number]  HPI  Patient presents to clinic today for her annual exam.    Flu: 12/2017 Tetanus: unsure COVID: x2 Shingrix: never Pap smear: 11/2019 Mammogram: >2 years ago Colon screening: never Vision screening: as needed Dentist: as needed  Diet: She does eat lean meat. She consumes veggies more than fruits. She tries to avoid fried foods. She drinks mostly water. Exercise: Yardwork  Review of Systems     Past Medical History:  Diagnosis Date   Anxiety    Breast mass in female    R side calcifiction   Depression    Mass    adernal gland being followed by urologist    Current Outpatient Medications  Medication Sig Dispense Refill   acetaminophen  (TYLENOL ) 500 MG tablet Take 500 mg by mouth every 6 (six) hours as needed.     atorvastatin  (LIPITOR) 20 MG tablet TAKE 1 TABLET BY MOUTH EVERY DAY 90 tablet 0   clotrimazole -betamethasone  (LOTRISONE ) cream Apply 1-2 times a day for worsening flare dry skin dermatitis of toes/feet, may re-use daily up to 1 week as needed. 30 g 1   hydrOXYzine  (VISTARIL ) 25 MG capsule TAKE 1 CAPSULE (25 MG TOTAL) BY MOUTH AT BEDTIME AS NEEDED. 90 capsule 1   methocarbamol  (ROBAXIN ) 500 MG tablet Take 1 tablet (500 mg total) by mouth every 8 (eight) hours as needed. 30 tablet 0   PARoxetine  (PAXIL -CR) 37.5 MG 24 hr tablet TAKE 1 TABLET BY MOUTH EVERY DAY 90 tablet 0   pramipexole  (MIRAPEX ) 0.5 MG tablet TAKE 1 TABLET (0.5 MG TOTAL) BY MOUTH AT BEDTIME AS NEEDED. 90 tablet 0   TRI-LO-MARZIA 0.18/0.215/0.25 MG-25 MCG TABS TAKE 1 TABLET BY MOUTH DAILY AT 12 NOON. 84 tablet 1   No current facility-administered medications for this visit.    Allergies  Allergen Reactions   Codeine Nausea Only    Nausea and vomiting    Family History  Problem Relation Age of Onset   Hypertension Mother    Depression Mother    Diabetes Father    Alcohol abuse Father     Anxiety disorder Father    Depression Father    Alcohol abuse Sister    Alcohol abuse Brother    Drug abuse Brother     Social History   Socioeconomic History   Marital status: Married    Spouse name: Not on file   Number of children: Not on file   Years of education: Not on file   Highest education level: 11th grade  Occupational History   Not on file  Tobacco Use   Smoking status: Former    Types: E-cigarettes   Smokeless tobacco: Never  Vaping Use   Vaping status: Every Day   Substances: Nicotine  Substance and Sexual Activity   Alcohol use: No    Alcohol/week: 0.0 standard drinks of alcohol   Drug use: No   Sexual activity: Yes  Other Topics Concern   Not on file  Social History Narrative   Not on file   Social Drivers of Health   Financial Resource Strain: Low Risk  (02/17/2024)   Overall Financial Resource Strain (CARDIA)    Difficulty of Paying Living Expenses: Not very hard  Food Insecurity: No Food Insecurity (02/17/2024)   Hunger Vital Sign    Worried About Running Out of Food in the Last Year: Never true  Ran Out of Food in the Last Year: Never true  Transportation Needs: No Transportation Needs (02/17/2024)   PRAPARE - Administrator, Civil Service (Medical): No    Lack of Transportation (Non-Medical): No  Physical Activity: Insufficiently Active (02/17/2024)   Exercise Vital Sign    Days of Exercise per Week: 2 days    Minutes of Exercise per Session: 20 min  Stress: Stress Concern Present (02/17/2024)   [employer] of Occupational Health - Occupational Stress Questionnaire    Feeling of Stress: Rather much  Social Connections: Socially Isolated (02/17/2024)   Social Connection and Isolation Panel    Frequency of Communication with Friends and Family: Once a week    Frequency of Social Gatherings with Friends and Family: Once a week    Attends Religious Services: Never    Database Administrator or Organizations: No    Attends Probation Officer: Not on file    Marital Status: Married  Catering Manager Violence: Not At Risk (01/28/2023)   Humiliation, Afraid, Rape, and Kick questionnaire    Fear of Current or Ex-Partner: No    Emotionally Abused: No    Physically Abused: No    Sexually Abused: No     Constitutional: Denies fever, malaise, fatigue, headache or abrupt weight changes.  HEENT: Denies eye pain, eye redness, ear pain, ringing in the ears, wax buildup, runny nose, nasal congestion, bloody nose, or sore throat. Respiratory: Denies difficulty breathing, shortness of breath, cough or sputum production.   Cardiovascular: Denies chest pain, chest tightness, palpitations or swelling in the hands or feet.  Gastrointestinal: Denies abdominal pain, bloating, constipation, diarrhea or blood in the stool.  GU: Denies urgency, frequency, pain with urination, burning sensation, blood in urine, odor or discharge. Musculoskeletal: Denies decrease in range of motion, difficulty with gait, muscle pain or joint pain and swelling.  Skin: Denies redness, rashes, lesions or ulcercations.  Neurological: Patient reports restless legs, insomnia.  Denies dizziness, difficulty with memory, difficulty with speech or problems with balance and coordination.  Psych: Patient has a history of anxiety and depression.  Denies anxiety, depression,   No other specific complaints in a complete review of systems (except as listed in HPI above).  Objective:   Physical Exam BP 124/80 (BP Location: Left Arm, Patient Position: Sitting, Cuff Size: Normal)   Ht 5' 9 (1.753 m)   Wt 177 lb 12.8 oz (80.6 kg)   LMP 04/07/2019   BMI 26.26 kg/m    Wt Readings from Last 3 Encounters:  09/13/23 167 lb 12.8 oz (76.1 kg)  01/28/23 171 lb (77.6 kg)  06/22/22 158 lb (71.7 kg)    General: Appears her stated age, overweight, in NAD. Skin: Warm, dry and intact.  HEENT: Head: normal shape and size; Eyes: sclera white, no icterus,  conjunctiva pink, PERRLA and EOMs intact;  Neck:  Neck supple, trachea midline. No masses, lumps or thyromegaly present.  Cardiovascular: Normal rate and rhythm. S1,S2 noted.  No murmur, rubs or gallops noted. No JVD or BLE edema. No carotid bruits noted. Pulmonary/Chest: Normal effort and positive vesicular breath sounds. No respiratory distress. No wheezes, rales or ronchi noted.  Abdomen: Normal bowel sounds.  Musculoskeletal: Strength 5/5 BUE/BLE. No difficulty with gait.  Neurological: Alert and oriented. Cranial nerves II-XII grossly intact. Coordination normal.  Psychiatric: Mood and affect normal.  Anxious appearing. Judgment and thought content normal.    BMET    Component Value Date/Time   NA 140  01/28/2023 1351   K 4.6 01/28/2023 1351   CL 103 01/28/2023 1351   CO2 30 01/28/2023 1351   GLUCOSE 85 01/28/2023 1351   BUN 11 01/28/2023 1351   CREATININE 1.17 (H) 01/28/2023 1351   CALCIUM  9.9 01/28/2023 1351   GFRNONAA >60 09/22/2020 1116   GFRNONAA 72 12/06/2019 0938   GFRAA 84 12/06/2019 0938    Lipid Panel     Component Value Date/Time   CHOL 170 01/28/2023 1351   TRIG 254 (H) 01/28/2023 1351   HDL 47 (L) 01/28/2023 1351   CHOLHDL 3.6 01/28/2023 1351   LDLCALC 90 01/28/2023 1351    CBC    Component Value Date/Time   WBC 5.0 01/28/2023 1351   RBC 4.60 01/28/2023 1351   HGB 14.8 01/28/2023 1351   HCT 43.0 01/28/2023 1351   PLT 246 01/28/2023 1351   MCV 93.5 01/28/2023 1351   MCH 32.2 01/28/2023 1351   MCHC 34.4 01/28/2023 1351   RDW 11.8 01/28/2023 1351   LYMPHSABS 2.2 09/22/2020 1116   MONOABS 0.9 09/22/2020 1116   EOSABS 0.2 09/22/2020 1116   BASOSABS 0.1 09/22/2020 1116    Hgb A1C Lab Results  Component Value Date   HGBA1C 5.7 (H) 01/28/2023            Assessment & Plan:   Preventative Health Maintenance:  She declines flu shot today She declines tetanus today Encourage her to get her COVID booster Discussed Shingrix vaccine, she will  check coverage with her insurance company and schedule a nurse visit if she would like to have this done Pap smear UTD She declines mammogram at this time She declines referral for colon screening of cologuard this time Encouraged her to consume a balanced diet and exercise regimen Advised her to see an eye doctor and dentist annually Will check CBC, c-Met, lipid, A1c  today  RTC in 6 months, follow-up chronic conditions Angeline Laura, NP

## 2024-02-21 ENCOUNTER — Ambulatory Visit: Payer: Self-pay | Admitting: Internal Medicine

## 2024-02-21 LAB — CBC
HCT: 46 % (ref 35.9–46.0)
Hemoglobin: 15.7 g/dL — ABNORMAL HIGH (ref 11.7–15.5)
MCH: 31.6 pg (ref 27.0–33.0)
MCHC: 34.1 g/dL (ref 31.6–35.4)
MCV: 92.6 fL (ref 81.4–101.7)
MPV: 9.7 fL (ref 7.5–12.5)
Platelets: 292 Thousand/uL (ref 140–400)
RBC: 4.97 Million/uL (ref 3.80–5.10)
RDW: 12.1 % (ref 11.0–15.0)
WBC: 9.3 Thousand/uL (ref 3.8–10.8)

## 2024-02-21 LAB — LIPID PANEL
Cholesterol: 153 mg/dL (ref <200)
HDL: 52 mg/dL (ref >=50)
LDL Cholesterol (Calc): 79 mg/dL
Non-HDL Cholesterol (Calc): 101 mg/dL (ref <130)
Total CHOL/HDL Ratio: 2.9 (calc) (ref <5.0)
Triglycerides: 128 mg/dL (ref <150)

## 2024-02-21 LAB — COMPREHENSIVE METABOLIC PANEL WITH GFR
AG Ratio: 2.2 (calc) (ref 1.0–2.5)
ALT: 21 U/L (ref 6–29)
AST: 16 U/L (ref 10–35)
Albumin: 4.9 g/dL (ref 3.6–5.1)
Alkaline phosphatase (APISO): 55 U/L (ref 37–153)
BUN: 16 mg/dL (ref 7–25)
CO2: 28 mmol/L (ref 20–32)
Calcium: 9.6 mg/dL (ref 8.6–10.4)
Chloride: 102 mmol/L (ref 98–110)
Creat: 0.98 mg/dL (ref 0.50–1.03)
Globulin: 2.2 g/dL (ref 1.9–3.7)
Glucose, Bld: 98 mg/dL (ref 65–99)
Potassium: 4.4 mmol/L (ref 3.5–5.3)
Sodium: 139 mmol/L (ref 135–146)
Total Bilirubin: 0.5 mg/dL (ref 0.2–1.2)
Total Protein: 7.1 g/dL (ref 6.1–8.1)
eGFR: 69 mL/min/1.73m2 (ref >=60)

## 2024-02-21 LAB — HEMOGLOBIN A1C
Hgb A1c MFr Bld: 5.5 % (ref <5.7)
Mean Plasma Glucose: 111 mg/dL
eAG (mmol/L): 6.2 mmol/L

## 2024-03-19 ENCOUNTER — Other Ambulatory Visit: Payer: Self-pay | Admitting: Internal Medicine

## 2024-03-20 NOTE — Telephone Encounter (Signed)
 Requested Prescriptions  Pending Prescriptions Disp Refills   atorvastatin  (LIPITOR) 20 MG tablet [Pharmacy Med Name: ATORVASTATIN  20 MG TABLET] 90 tablet 3    Sig: TAKE 1 TABLET BY MOUTH EVERY DAY     Cardiovascular:  Antilipid - Statins Failed - 03/20/2024  1:54 PM      Failed - Lipid Panel in normal range within the last 12 months    Cholesterol  Date Value Ref Range Status  02/20/2024 153 <200 mg/dL Final   LDL Cholesterol (Calc)  Date Value Ref Range Status  02/20/2024 79 mg/dL (calc) Final    Comment:    Reference range: <100 . Desirable range <100 mg/dL for primary prevention;   <70 mg/dL for patients with CHD or diabetic patients  with > or = 2 CHD risk factors. SABRA LDL-C is now calculated using the Martin-Hopkins  calculation, which is a validated novel method providing  better accuracy than the Friedewald equation in the  estimation of LDL-C.  Gladis APPLETHWAITE et al. SANDREA. 7986;689(80): 2061-2068  (http://education.[facility].com/faq/FAQ164)    HDL  Date Value Ref Range Status  02/20/2024 52 > OR = 50 mg/dL Final   Triglycerides  Date Value Ref Range Status  02/20/2024 128 <150 mg/dL Final         Passed - Patient is not pregnant      Passed - Valid encounter within last 12 months    Recent Outpatient Visits           4 weeks ago Encounter for general adult medical examination with abnormal findings   [place] [place], [place] W, NP   6 months ago Acute right-sided low back pain without sciatica   [place] [facility] [place], Angeline ORN, NP   7 months ago Primary hypertension   [place] [facility] [place], Angeline ORN, [place]

## 2024-03-30 ENCOUNTER — Encounter: Payer: Self-pay | Admitting: Internal Medicine

## 2024-04-04 ENCOUNTER — Other Ambulatory Visit: Payer: Self-pay | Admitting: Internal Medicine

## 2024-04-04 NOTE — Telephone Encounter (Signed)
 Requested Prescriptions  Pending Prescriptions Disp Refills   pramipexole  (MIRAPEX ) 0.5 MG tablet [Pharmacy Med Name: PRAMIPEXOLE  0.5 MG TABLET] 90 tablet 0    Sig: TAKE 1 TABLET (0.5 MG TOTAL) BY MOUTH AT BEDTIME AS NEEDED.     Neurology:  Parkinsonian Agents Passed - 04/04/2024 11:52 AM      Passed - Last BP in normal range    BP Readings from Last 1 Encounters:  02/20/24 124/80         Passed - Last Heart Rate in normal range    Pulse Readings from Last 1 Encounters:  06/22/22 97         Passed - Valid encounter within last 12 months    Recent Outpatient Visits           1 month ago Encounter for general adult medical examination with abnormal findings   [place] [facility] [place], [place] W, NP   6 months ago Acute right-sided low back pain without sciatica   [facility] [place], Angeline ORN, NP   7 months ago Primary hypertension   [place] [facility] [place], Angeline ORN, [place]

## 2024-04-14 ENCOUNTER — Other Ambulatory Visit: Payer: Self-pay | Admitting: Internal Medicine

## 2024-04-16 NOTE — Telephone Encounter (Signed)
 Requested medication (s) are due for refill today: routing for review  Requested medication (s) are on the active medication list: no  Last refill:  08/22/23  Future visit scheduled: no  Notes to clinic:  Unable to refill per protocol, Rx expired.      Requested Prescriptions  Pending Prescriptions Disp Refills   valACYclovir  (VALTREX ) 1000 MG tablet [Pharmacy Med Name: VALACYCLOVIR  HCL 1 GRAM TABLET] 30 tablet 5    Sig: TAKE 1 TABLET BY MOUTH EVERY DAY     Antimicrobials:  Antiviral Agents - Anti-Herpetic Passed - 04/16/2024  3:24 PM      Passed - Valid encounter within last 12 months    Recent Outpatient Visits           1 month ago Encounter for general adult medical examination with abnormal findings   [place] [facility] [place], [place] W, NP   7 months ago Acute right-sided low back pain without sciatica   [facility] [place], Angeline ORN, NP   7 months ago Primary hypertension   [place] [facility] [place], Angeline ORN, [place]

## 2024-08-21 ENCOUNTER — Ambulatory Visit: Admitting: Internal Medicine
